# Patient Record
Sex: Female | Born: 1940 | Race: White | Hispanic: No | Marital: Married | Smoking: Never smoker
Health system: Southern US, Community
[De-identification: ages and names within clinical notes are randomized; demographics above are authoritative.]

## PROBLEM LIST (undated history)

## (undated) DIAGNOSIS — E119 Type 2 diabetes mellitus without complications: Secondary | ICD-10-CM

## (undated) DIAGNOSIS — R42 Dizziness and giddiness: Secondary | ICD-10-CM

## (undated) DIAGNOSIS — I10 Essential (primary) hypertension: Secondary | ICD-10-CM

## (undated) DIAGNOSIS — K219 Gastro-esophageal reflux disease without esophagitis: Secondary | ICD-10-CM

## (undated) DIAGNOSIS — G629 Polyneuropathy, unspecified: Secondary | ICD-10-CM

## (undated) DIAGNOSIS — K589 Irritable bowel syndrome without diarrhea: Secondary | ICD-10-CM

## (undated) HISTORY — PX: BACK SURGERY: SHX140

## (undated) HISTORY — PX: ABDOMINAL HYSTERECTOMY: SHX81

---

## 1999-03-21 ENCOUNTER — Encounter: Admission: RE | Admit: 1999-03-21 | Discharge: 1999-03-21 | Payer: Self-pay | Admitting: Gynecology

## 1999-03-21 ENCOUNTER — Encounter: Payer: Self-pay | Admitting: Gynecology

## 2000-08-25 ENCOUNTER — Ambulatory Visit (HOSPITAL_COMMUNITY): Admission: RE | Admit: 2000-08-25 | Discharge: 2000-08-25 | Payer: Self-pay | Admitting: Gastroenterology

## 2000-09-01 ENCOUNTER — Encounter: Payer: Self-pay | Admitting: Internal Medicine

## 2000-09-01 ENCOUNTER — Ambulatory Visit (HOSPITAL_COMMUNITY): Admission: RE | Admit: 2000-09-01 | Discharge: 2000-09-01 | Payer: Self-pay | Admitting: Internal Medicine

## 2000-09-22 ENCOUNTER — Inpatient Hospital Stay (HOSPITAL_COMMUNITY): Admission: AD | Admit: 2000-09-22 | Discharge: 2000-09-26 | Payer: Self-pay | Admitting: Internal Medicine

## 2000-09-22 ENCOUNTER — Encounter: Payer: Self-pay | Admitting: Internal Medicine

## 2000-09-23 ENCOUNTER — Encounter: Payer: Self-pay | Admitting: Internal Medicine

## 2000-09-30 ENCOUNTER — Ambulatory Visit (HOSPITAL_COMMUNITY): Admission: RE | Admit: 2000-09-30 | Discharge: 2000-09-30 | Payer: Self-pay | Admitting: *Deleted

## 2000-10-22 ENCOUNTER — Encounter: Payer: Self-pay | Admitting: Emergency Medicine

## 2000-10-22 ENCOUNTER — Encounter (HOSPITAL_COMMUNITY): Admission: RE | Admit: 2000-10-22 | Discharge: 2000-11-21 | Payer: Self-pay | Admitting: Emergency Medicine

## 2000-10-22 ENCOUNTER — Emergency Department (HOSPITAL_COMMUNITY): Admission: EM | Admit: 2000-10-22 | Discharge: 2000-10-22 | Payer: Self-pay | Admitting: Emergency Medicine

## 2000-11-11 ENCOUNTER — Ambulatory Visit (HOSPITAL_COMMUNITY): Admission: RE | Admit: 2000-11-11 | Discharge: 2000-11-11 | Payer: Self-pay | Admitting: *Deleted

## 2000-11-28 ENCOUNTER — Ambulatory Visit (HOSPITAL_COMMUNITY): Admission: RE | Admit: 2000-11-28 | Discharge: 2000-11-28 | Payer: Self-pay | Admitting: Internal Medicine

## 2001-03-20 ENCOUNTER — Encounter: Payer: Self-pay | Admitting: Internal Medicine

## 2001-03-20 ENCOUNTER — Ambulatory Visit (HOSPITAL_COMMUNITY): Admission: RE | Admit: 2001-03-20 | Discharge: 2001-03-20 | Payer: Self-pay | Admitting: Internal Medicine

## 2001-10-30 ENCOUNTER — Emergency Department (HOSPITAL_COMMUNITY): Admission: EM | Admit: 2001-10-30 | Discharge: 2001-10-30 | Payer: Self-pay | Admitting: Emergency Medicine

## 2004-04-03 ENCOUNTER — Ambulatory Visit: Payer: Self-pay | Admitting: Internal Medicine

## 2004-10-02 ENCOUNTER — Ambulatory Visit: Payer: Self-pay | Admitting: Internal Medicine

## 2004-12-17 ENCOUNTER — Ambulatory Visit (HOSPITAL_COMMUNITY): Payer: Self-pay | Admitting: Neurology

## 2004-12-17 ENCOUNTER — Encounter (HOSPITAL_COMMUNITY): Admission: RE | Admit: 2004-12-17 | Discharge: 2005-01-16 | Payer: Self-pay | Admitting: Neurology

## 2005-01-01 ENCOUNTER — Ambulatory Visit: Payer: Self-pay | Admitting: Internal Medicine

## 2005-01-24 ENCOUNTER — Ambulatory Visit (HOSPITAL_COMMUNITY): Admission: RE | Admit: 2005-01-24 | Discharge: 2005-01-24 | Payer: Self-pay | Admitting: Internal Medicine

## 2005-01-24 ENCOUNTER — Ambulatory Visit: Payer: Self-pay | Admitting: Internal Medicine

## 2005-01-24 ENCOUNTER — Encounter (INDEPENDENT_AMBULATORY_CARE_PROVIDER_SITE_OTHER): Payer: Self-pay | Admitting: Internal Medicine

## 2005-01-24 HISTORY — PX: COLONOSCOPY: SHX174

## 2005-01-24 HISTORY — PX: ESOPHAGOGASTRODUODENOSCOPY: SHX1529

## 2005-04-02 ENCOUNTER — Ambulatory Visit: Payer: Self-pay | Admitting: Internal Medicine

## 2005-04-29 ENCOUNTER — Ambulatory Visit: Payer: Self-pay | Admitting: Internal Medicine

## 2005-05-31 ENCOUNTER — Ambulatory Visit (HOSPITAL_COMMUNITY): Admission: RE | Admit: 2005-05-31 | Discharge: 2005-05-31 | Payer: Self-pay | Admitting: Internal Medicine

## 2005-06-18 ENCOUNTER — Ambulatory Visit (HOSPITAL_COMMUNITY): Admission: RE | Admit: 2005-06-18 | Discharge: 2005-06-18 | Payer: Self-pay | Admitting: Ophthalmology

## 2005-07-16 ENCOUNTER — Ambulatory Visit (HOSPITAL_COMMUNITY): Admission: RE | Admit: 2005-07-16 | Discharge: 2005-07-16 | Payer: Self-pay | Admitting: Ophthalmology

## 2005-07-25 ENCOUNTER — Ambulatory Visit (HOSPITAL_COMMUNITY): Admission: RE | Admit: 2005-07-25 | Discharge: 2005-07-25 | Payer: Self-pay | Admitting: Internal Medicine

## 2005-07-29 ENCOUNTER — Ambulatory Visit: Payer: Self-pay | Admitting: Internal Medicine

## 2006-07-28 ENCOUNTER — Ambulatory Visit (HOSPITAL_COMMUNITY): Admission: RE | Admit: 2006-07-28 | Discharge: 2006-07-28 | Payer: Self-pay | Admitting: Obstetrics and Gynecology

## 2006-09-04 ENCOUNTER — Ambulatory Visit (HOSPITAL_COMMUNITY): Admission: RE | Admit: 2006-09-04 | Discharge: 2006-09-04 | Payer: Self-pay | Admitting: Internal Medicine

## 2006-09-08 ENCOUNTER — Inpatient Hospital Stay (HOSPITAL_COMMUNITY): Admission: AD | Admit: 2006-09-08 | Discharge: 2006-09-16 | Payer: Self-pay | Admitting: Internal Medicine

## 2006-12-01 ENCOUNTER — Emergency Department (HOSPITAL_COMMUNITY): Admission: EM | Admit: 2006-12-01 | Discharge: 2006-12-01 | Payer: Self-pay | Admitting: Emergency Medicine

## 2007-05-21 HISTORY — PX: COLONOSCOPY: SHX174

## 2007-08-03 ENCOUNTER — Ambulatory Visit (HOSPITAL_COMMUNITY): Admission: RE | Admit: 2007-08-03 | Discharge: 2007-08-03 | Payer: Self-pay | Admitting: Internal Medicine

## 2007-08-10 ENCOUNTER — Ambulatory Visit (HOSPITAL_COMMUNITY): Admission: RE | Admit: 2007-08-10 | Discharge: 2007-08-10 | Payer: Self-pay | Admitting: Internal Medicine

## 2007-09-20 ENCOUNTER — Inpatient Hospital Stay (HOSPITAL_COMMUNITY): Admission: EM | Admit: 2007-09-20 | Discharge: 2007-09-22 | Payer: Self-pay | Admitting: Emergency Medicine

## 2007-09-20 ENCOUNTER — Ambulatory Visit: Payer: Self-pay | Admitting: Internal Medicine

## 2007-09-21 ENCOUNTER — Encounter: Payer: Self-pay | Admitting: Internal Medicine

## 2007-09-22 ENCOUNTER — Ambulatory Visit: Payer: Self-pay | Admitting: Gastroenterology

## 2007-11-03 ENCOUNTER — Ambulatory Visit: Payer: Self-pay | Admitting: Internal Medicine

## 2008-02-25 ENCOUNTER — Ambulatory Visit: Admission: RE | Admit: 2008-02-25 | Discharge: 2008-02-25 | Payer: Self-pay | Admitting: Neurology

## 2008-05-24 ENCOUNTER — Ambulatory Visit (HOSPITAL_COMMUNITY): Admission: RE | Admit: 2008-05-24 | Discharge: 2008-05-24 | Payer: Self-pay | Admitting: Internal Medicine

## 2008-10-11 ENCOUNTER — Encounter (INDEPENDENT_AMBULATORY_CARE_PROVIDER_SITE_OTHER): Payer: Self-pay | Admitting: *Deleted

## 2008-11-25 DIAGNOSIS — R197 Diarrhea, unspecified: Secondary | ICD-10-CM

## 2008-11-25 DIAGNOSIS — Z8719 Personal history of other diseases of the digestive system: Secondary | ICD-10-CM

## 2008-11-25 DIAGNOSIS — K219 Gastro-esophageal reflux disease without esophagitis: Secondary | ICD-10-CM | POA: Insufficient documentation

## 2008-11-25 DIAGNOSIS — R159 Full incontinence of feces: Secondary | ICD-10-CM | POA: Insufficient documentation

## 2008-11-25 DIAGNOSIS — K559 Vascular disorder of intestine, unspecified: Secondary | ICD-10-CM | POA: Insufficient documentation

## 2008-11-25 DIAGNOSIS — K589 Irritable bowel syndrome without diarrhea: Secondary | ICD-10-CM | POA: Insufficient documentation

## 2008-11-25 DIAGNOSIS — R109 Unspecified abdominal pain: Secondary | ICD-10-CM | POA: Insufficient documentation

## 2008-11-25 DIAGNOSIS — K2960 Other gastritis without bleeding: Secondary | ICD-10-CM | POA: Insufficient documentation

## 2008-11-29 ENCOUNTER — Ambulatory Visit: Payer: Self-pay | Admitting: Internal Medicine

## 2008-11-29 DIAGNOSIS — K921 Melena: Secondary | ICD-10-CM

## 2008-12-26 ENCOUNTER — Telehealth (INDEPENDENT_AMBULATORY_CARE_PROVIDER_SITE_OTHER): Payer: Self-pay

## 2009-01-13 ENCOUNTER — Encounter: Payer: Self-pay | Admitting: Internal Medicine

## 2009-01-17 ENCOUNTER — Ambulatory Visit: Payer: Self-pay | Admitting: Internal Medicine

## 2009-01-17 ENCOUNTER — Encounter: Admission: RE | Admit: 2009-01-17 | Discharge: 2009-01-17 | Payer: Self-pay | Admitting: Podiatry

## 2009-01-18 ENCOUNTER — Telehealth (INDEPENDENT_AMBULATORY_CARE_PROVIDER_SITE_OTHER): Payer: Self-pay

## 2009-01-19 ENCOUNTER — Encounter: Payer: Self-pay | Admitting: Internal Medicine

## 2009-01-19 ENCOUNTER — Ambulatory Visit (HOSPITAL_COMMUNITY): Admission: RE | Admit: 2009-01-19 | Discharge: 2009-01-19 | Payer: Self-pay | Admitting: Internal Medicine

## 2009-03-02 ENCOUNTER — Telehealth (INDEPENDENT_AMBULATORY_CARE_PROVIDER_SITE_OTHER): Payer: Self-pay

## 2009-03-03 ENCOUNTER — Ambulatory Visit: Payer: Self-pay | Admitting: Gastroenterology

## 2009-03-03 ENCOUNTER — Encounter: Payer: Self-pay | Admitting: Gastroenterology

## 2009-03-03 DIAGNOSIS — K5732 Diverticulitis of large intestine without perforation or abscess without bleeding: Secondary | ICD-10-CM | POA: Insufficient documentation

## 2009-03-03 LAB — CONVERTED CEMR LAB
Basophils Absolute: 0 10*3/uL (ref 0.0–0.1)
Basophils Relative: 0 % (ref 0–1)
Eosinophils Absolute: 0.2 10*3/uL (ref 0.0–0.7)
Eosinophils Relative: 2 % (ref 0–5)
HCT: 43.8 % (ref 36.0–46.0)
Hemoglobin: 14.9 g/dL (ref 12.0–15.0)
MCHC: 34.1 g/dL (ref 30.0–36.0)
MCV: 94.7 fL (ref 78.0–100.0)
Monocytes Absolute: 1 10*3/uL (ref 0.1–1.0)
Neutro Abs: 8.3 10*3/uL — ABNORMAL HIGH (ref 1.7–7.7)
RDW: 12.7 % (ref 11.5–15.5)

## 2009-03-04 ENCOUNTER — Ambulatory Visit (HOSPITAL_COMMUNITY): Admission: RE | Admit: 2009-03-04 | Discharge: 2009-03-04 | Payer: Self-pay | Admitting: Internal Medicine

## 2009-03-06 ENCOUNTER — Telehealth (INDEPENDENT_AMBULATORY_CARE_PROVIDER_SITE_OTHER): Payer: Self-pay

## 2009-03-06 ENCOUNTER — Encounter: Payer: Self-pay | Admitting: Internal Medicine

## 2009-03-21 ENCOUNTER — Telehealth (INDEPENDENT_AMBULATORY_CARE_PROVIDER_SITE_OTHER): Payer: Self-pay

## 2009-03-28 ENCOUNTER — Ambulatory Visit (HOSPITAL_COMMUNITY): Admission: RE | Admit: 2009-03-28 | Discharge: 2009-03-28 | Payer: Self-pay | Admitting: Internal Medicine

## 2009-03-28 ENCOUNTER — Encounter: Payer: Self-pay | Admitting: Internal Medicine

## 2009-03-28 ENCOUNTER — Ambulatory Visit: Payer: Self-pay | Admitting: Internal Medicine

## 2009-03-29 ENCOUNTER — Encounter: Payer: Self-pay | Admitting: Internal Medicine

## 2009-08-03 ENCOUNTER — Ambulatory Visit (HOSPITAL_COMMUNITY): Admission: RE | Admit: 2009-08-03 | Discharge: 2009-08-03 | Payer: Self-pay | Admitting: Internal Medicine

## 2009-11-02 ENCOUNTER — Ambulatory Visit (HOSPITAL_COMMUNITY): Admission: RE | Admit: 2009-11-02 | Discharge: 2009-11-02 | Payer: Self-pay | Admitting: Internal Medicine

## 2009-11-03 IMAGING — CT CT ABDOMEN W/ CM
2 of 5 series · 16 of 46 positions shown, 18 images · IV contrast (Omnipaque 300)
Comparison: CT 01/19/2009

 CT ABDOMEN

03/04/2009 - DUPLICATE COPY for exam association in [facility] – No change from original report.
CLINICAL DATA: History of diverticulitis. Lower abdominal pain.
 Blood in stool

 CT ABDOMEN AND PELVIS WITH CONTRAST
TECHNIQUE: Multidetector CT imaging of the abdomen and pelvis was
 performed using the standard protocol following bolus
 administration of intravenous contrast.
 Contrast: 100 ml Bmnipaque-EDD IV and oral contrast media.

[Series 2: abd_pel_with 5.0 b40f · axial · 0.81mm/px · z∈[-445,-15]mm · 13 of 100 slices shown, 15 images]
[im 7/100  soft-tissue]
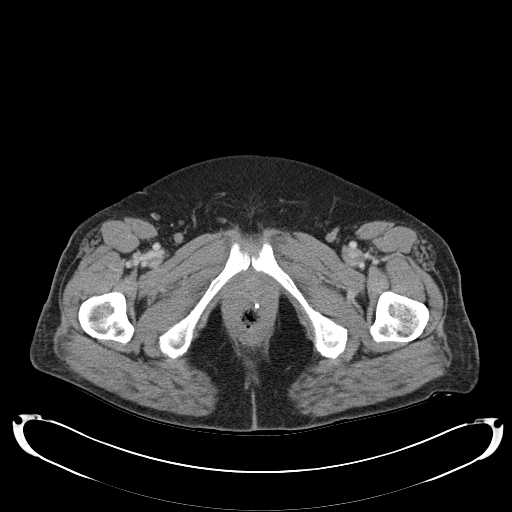
[im 7/100  bone]
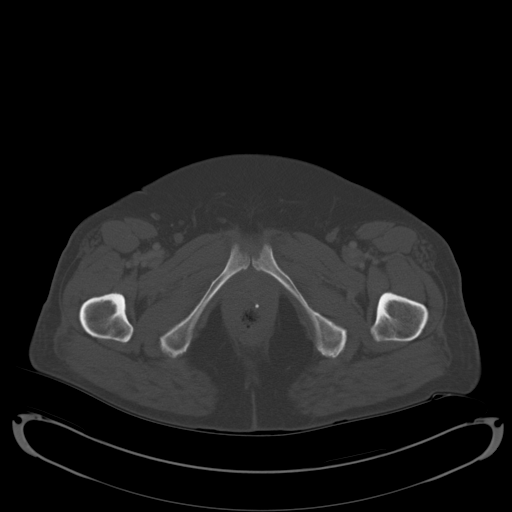
[im 13/100  soft-tissue]
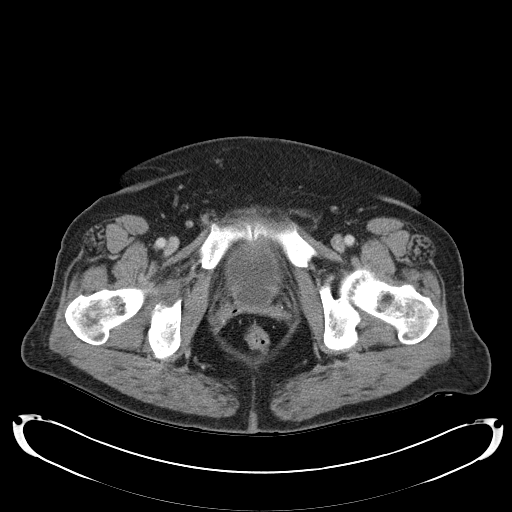
[im 19/100  soft-tissue]
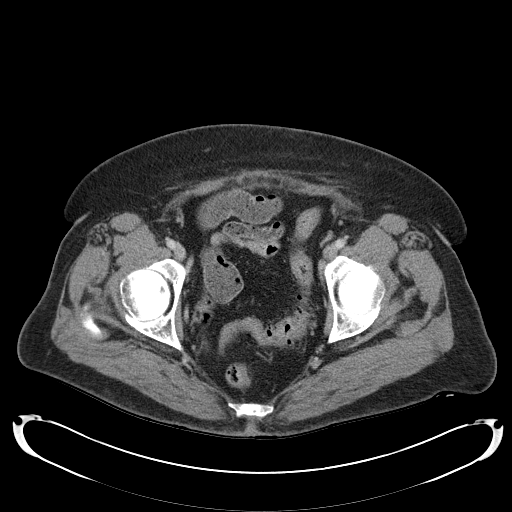
[im 31/100  soft-tissue]
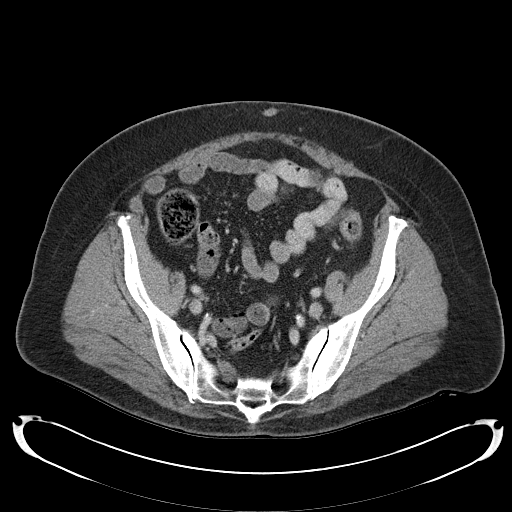
[im 38/100  soft-tissue]
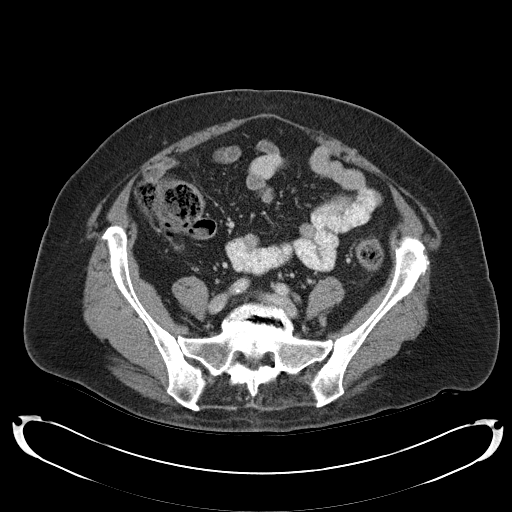
[im 44/100  soft-tissue]
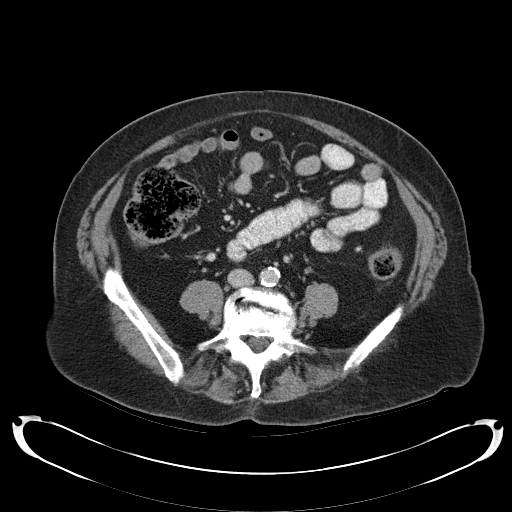
[im 50/100  soft-tissue]
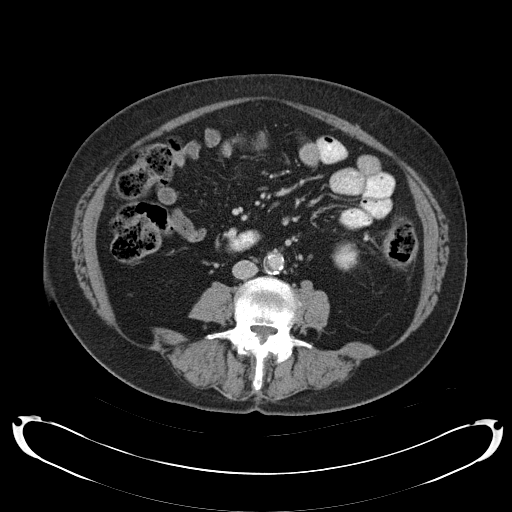
[im 56/100  soft-tissue]
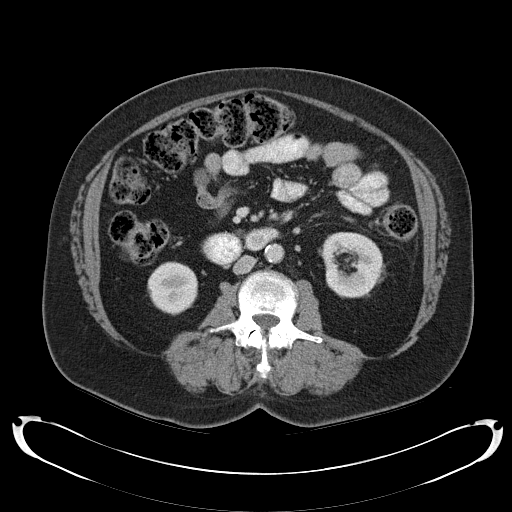
[im 62/100  soft-tissue]
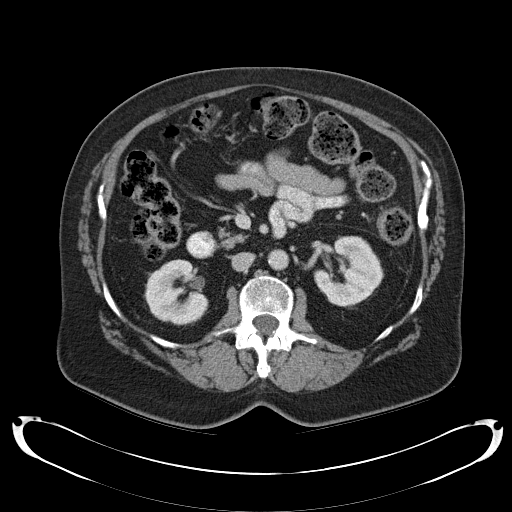
[im 62/100  bone]
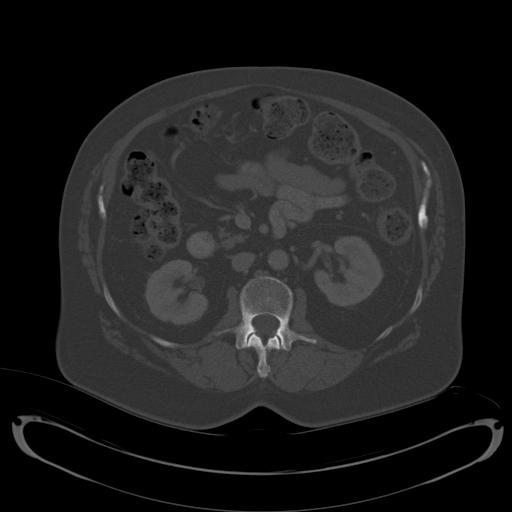
[im 69/100  soft-tissue]
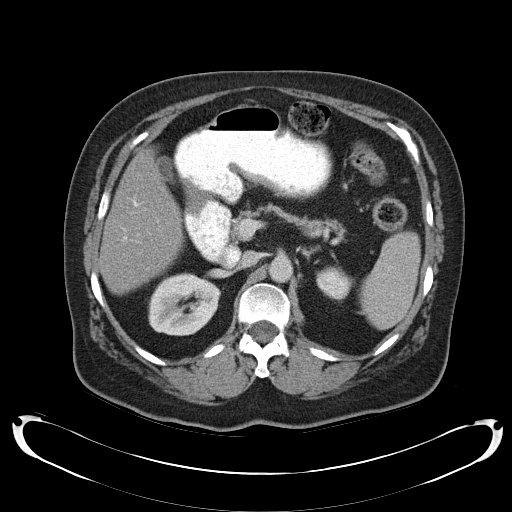
[im 81/100  soft-tissue]
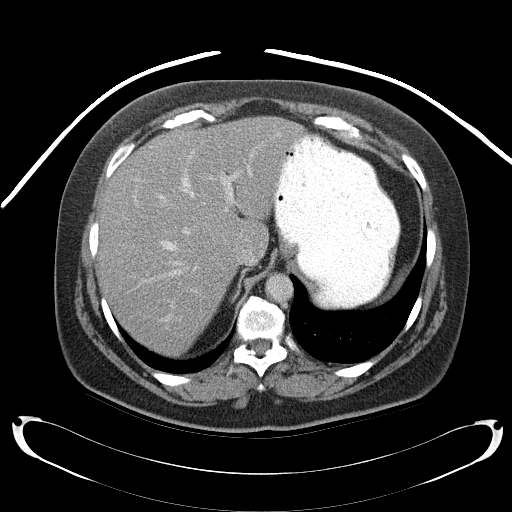
[im 87/100  soft-tissue]
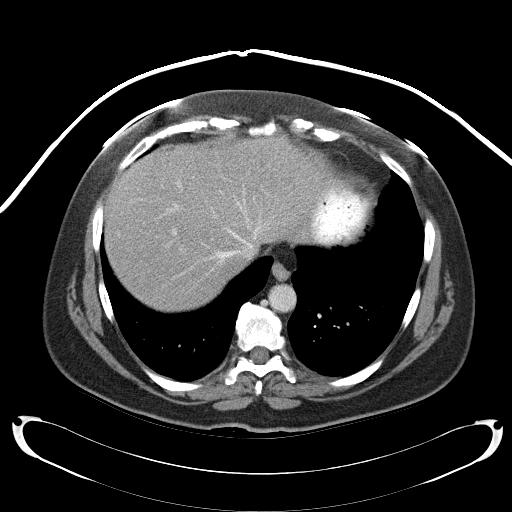
[im 93/100  soft-tissue]
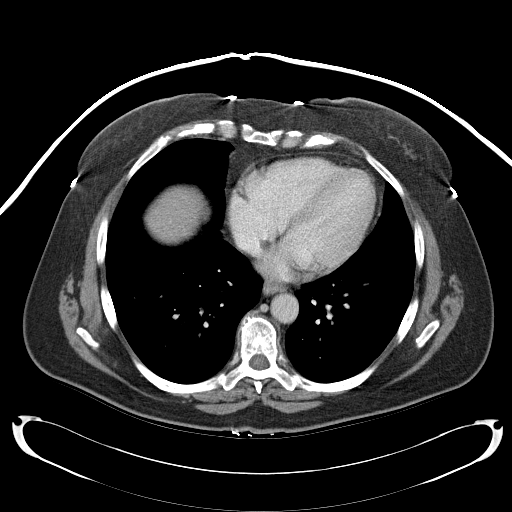

[Series 4: mpr cor post contrast (id) · coronal · 0.75mm/px · 3 of 91 slices shown]
[im 31/91  soft-tissue]
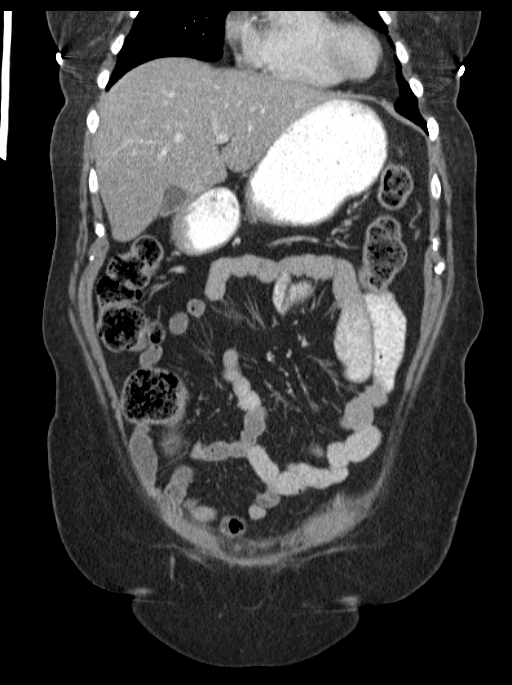
[im 41/91  soft-tissue]
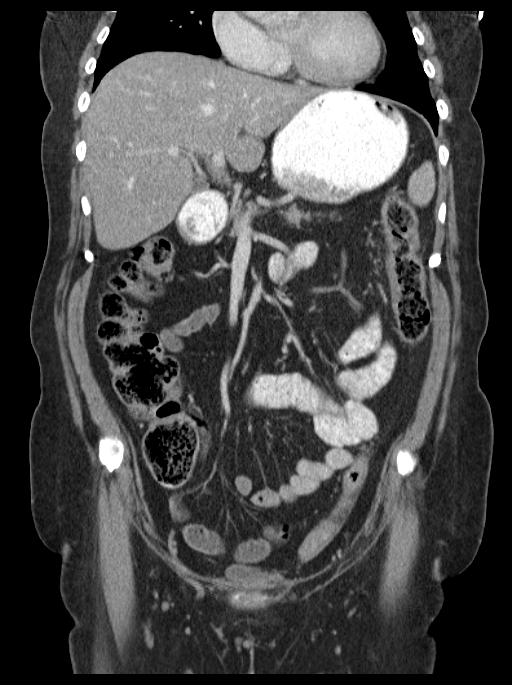
[im 51/91  soft-tissue]
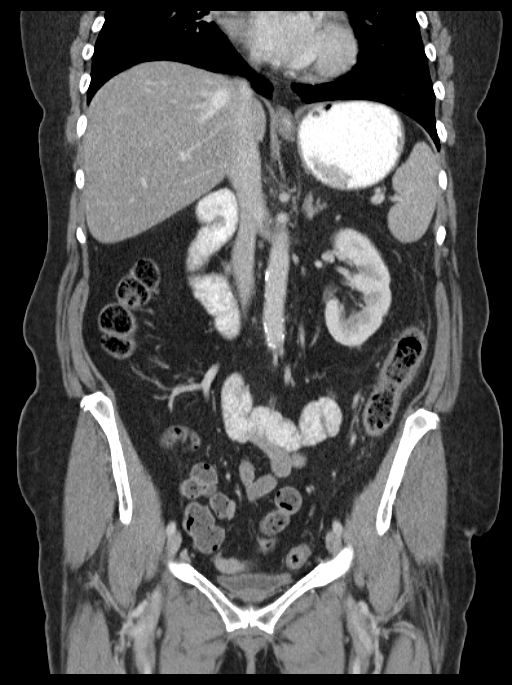

[16 of 46 positions shown; findings below may reference images not displayed]

FINDINGS: Fatty infiltration of the liver. No hepatic space-
 occupying lesions. No biliary ductal dilatation. Calcified
 splenic granuloma. Atrophic pancreas. Segmental wall thickening
 of the descending colon extending over a rather lengthy segment
 with mild pericolic edema and an injected appearance of the vessels
 subtending the colon. The findings are compatible with colitis. No
 small bowel wall thickening. I would favor colitis over
 diverticulitis. Findings are similar to the 01/19/2009 exam.
IMPRESSION: Findings consistent with colitis of the left colon.

 CT PELVIS
FINDINGS: No acute pelvic findings. Sigmoid colon diverticula.
 Negative for diverticulitis. No wall thickening of the rectum.
 Hysterectomy.
IMPRESSION: Sigmoid diverticulosis.

## 2010-04-13 ENCOUNTER — Encounter: Admission: RE | Admit: 2010-04-13 | Discharge: 2010-04-13 | Payer: Self-pay | Admitting: Neurosurgery

## 2010-06-06 ENCOUNTER — Inpatient Hospital Stay (HOSPITAL_COMMUNITY)
Admission: RE | Admit: 2010-06-06 | Discharge: 2010-06-09 | Payer: Self-pay | Source: Home / Self Care | Attending: Neurosurgery | Admitting: Neurosurgery

## 2010-06-06 LAB — DIFFERENTIAL
Basophils Absolute: 0 10*3/uL (ref 0.0–0.1)
Basophils Relative: 0 % (ref 0–1)
Eosinophils Absolute: 0.3 10*3/uL (ref 0.0–0.7)
Eosinophils Relative: 4 % (ref 0–5)
Lymphocytes Relative: 19 % (ref 12–46)
Lymphs Abs: 1.5 10*3/uL (ref 0.7–4.0)
Monocytes Absolute: 0.7 10*3/uL (ref 0.1–1.0)
Monocytes Relative: 8 % (ref 3–12)
Neutro Abs: 5.5 10*3/uL (ref 1.7–7.7)
Neutrophils Relative %: 69 % (ref 43–77)

## 2010-06-06 LAB — SURGICAL PCR SCREEN
MRSA, PCR: NEGATIVE
Staphylococcus aureus: NEGATIVE

## 2010-06-06 LAB — CBC
HCT: 41.1 % (ref 36.0–46.0)
Hemoglobin: 13.2 g/dL (ref 12.0–15.0)
MCH: 29.5 pg (ref 26.0–34.0)
MCHC: 32.1 g/dL (ref 30.0–36.0)
MCV: 91.7 fL (ref 78.0–100.0)
Platelets: 164 10*3/uL (ref 150–400)
RBC: 4.48 MIL/uL (ref 3.87–5.11)
RDW: 12.6 % (ref 11.5–15.5)
WBC: 8 10*3/uL (ref 4.0–10.5)

## 2010-06-06 LAB — TYPE AND SCREEN
ABO/RH(D): A POS
Antibody Screen: NEGATIVE

## 2010-06-06 LAB — BASIC METABOLIC PANEL
BUN: 16 mg/dL (ref 6–23)
CO2: 31 mEq/L (ref 19–32)
Calcium: 9.4 mg/dL (ref 8.4–10.5)
Chloride: 99 mEq/L (ref 96–112)
Creatinine, Ser: 0.76 mg/dL (ref 0.4–1.2)
GFR calc Af Amer: >60 mL/min (ref >60)
GFR calc non Af Amer: >60 mL/min (ref >60)
Glucose, Bld: 140 mg/dL — ABNORMAL HIGH (ref 70–99)
Potassium: 4.3 mEq/L (ref 3.5–5.1)
Sodium: 139 mEq/L (ref 135–145)

## 2010-06-06 LAB — ABO/RH: ABO/RH(D): A POS

## 2010-06-11 LAB — BASIC METABOLIC PANEL
BUN: 11 mg/dL (ref 6–23)
CO2: 28 mEq/L (ref 19–32)
Calcium: 8.4 mg/dL (ref 8.4–10.5)
Creatinine, Ser: 0.8 mg/dL (ref 0.4–1.2)
Glucose, Bld: 143 mg/dL — ABNORMAL HIGH (ref 70–99)

## 2010-06-11 LAB — GLUCOSE, CAPILLARY
Glucose-Capillary: 117 mg/dL — ABNORMAL HIGH (ref 70–99)
Glucose-Capillary: 98 mg/dL (ref 70–99)
Glucose-Capillary: 165 mg/dL — ABNORMAL HIGH (ref 70–99)
Glucose-Capillary: 119 mg/dL — ABNORMAL HIGH (ref 70–99)

## 2010-06-11 LAB — CBC
HCT: 37.4 % (ref 36.0–46.0)
Hemoglobin: 12.3 g/dL (ref 12.0–15.0)
MCH: 30.1 pg (ref 26.0–34.0)
MCHC: 32.9 g/dL (ref 30.0–36.0)

## 2010-06-12 LAB — GLUCOSE, CAPILLARY
Glucose-Capillary: 126 mg/dL — ABNORMAL HIGH (ref 70–99)
Glucose-Capillary: 124 mg/dL — ABNORMAL HIGH (ref 70–99)

## 2010-06-21 NOTE — Discharge Summary (Signed)
  [name], Paula [name]                ACCOUNT NO.:  [id number]  MEDICAL RECORD NO.:  [id number]          PATIENT TYPE:  INP  LOCATION:  3014                         FACILITY:  [facility]  PHYSICIAN:  Cristi Loron, M.D.DATE OF BIRTH:  January 29, 1941  DATE OF ADMISSION:  06/06/2010 DATE OF DISCHARGE:  06/09/2010                              DISCHARGE SUMMARY   BRIEF HISTORY:  The patient is a 70 year old white female who has had prior back surgery.  She has developed recurrent back and leg pain consistent with neurogenic claudication.  She has failed medical management, was worked up with a lumbar MRI which demonstrated the patient had multilevel disk degeneration.  The patient had developed a spondylolisthesis, spinal stenosis at L3-4.  I discussed various treatment options with the patient including surgery.  The patient has weighed the risks, benefits, and alternatives of surgery and decided to proceed with the L3-L5 decompression instrumentation and fusion.  For further details of this admission, please refer to typed history and physical.  HOSPITAL COURSE:  I admitted the patient to [facility] on June 06, 2010.  On day of admission, I performed L3-4 decompression instrumentation and fusion.  The surgery went well (for full details of this operation, please refer to typed operative note).  POSTOPERATIVE COURSE:  The patient's postop course was unremarkable. She was discharged home on postop day #3.  DISCHARGE INSTRUCTIONS:  The patient was given oral and written discharge instructions.  All her questions were answered.  DISCHARGE PRESCRIPTIONS: 1. Percocet 10/325, 1/2 to 1 p.o. q.4 h. p.r.n. for pain. 2. Valium 5 mg, #50, 1 p.o. q.6 h. p.r.n. for muscle spasms.  FINAL DIAGNOSES:  Is L3-4 spondylolisthesis, spinal stenosis, disk degeneration, lumbago, lumbar radiculopathy.  PROCEDURE PERFORMED:  Bilateral L3 laminotomies and foraminotomies to decompress the  bilateral L3 as well as L4 nerve roots (work required to do this was in addition to work required to do the posterior lumbar interbody fusion because of the patient's severe facet arthropathy and spinal stenosis requiring bilateral foraminotomies and medial facetectomy); L3-4 posterior lumbar fusion with local morselized autograft bone and Actifuse bone graft extender; insertion of L3-4 interbody prosthesis (PEEK interbody prosthesis; L3-4 posterior instrumentation with legacy titanium pedicle screw rods; L3-4 posterolateral arthrodesis with local morselized autograft bone and Vitoss bone graft).     Cristi Loron, M.D.     JDJ/MEDQ  D:  06/09/2010  T:  06/09/2010  Job:  [id number]  Electronically Signed by Tressie Stalker M.D. on 06/21/2010 10:55:52 AM

## 2010-06-21 NOTE — Op Note (Signed)
NAMESHADI, LARNER                ACCOUNT NO.:  [id number]  MEDICAL RECORD NO.:  [id number]          PATIENT TYPE:  INP  LOCATION:  2899                         FACILITY:  [facility]  PHYSICIAN:  Cristi Loron, M.D.DATE OF BIRTH:  [birth year]  DATE OF PROCEDURE:  06/06/2010 DATE OF DISCHARGE:                              OPERATIVE REPORT   BRIEF HISTORY:  The patient is a 70 year old white female who has had prior back surgeries.  She has developed recurrent back and leg pain consistent with neurogenic claudication.  She failed medical management, was worked up with a lumbar MRI which demonstrated the patient had a multilevel disk degeneration, but had developed a spondylolisthesis and spinal stenosis at L3-4.  I discussed the various treatment options with the patient including surgery.  The patient has weighed the risks, benefits, and alternatives of the surgery, decided to proceed with an L3- 4 decompression, instrumentation, and fusion.  PREOPERATIVE DIAGNOSES:  L3-4 spondylolisthesis, spinal stenosis, disk degeneration, lumbago, lumbar radiculopathy.  POSTOPERATIVE DIAGNOSES:  L3-4 spondylolisthesis, spinal stenosis, disk degeneration, lumbago, lumbar radiculopathy.  PROCEDURE:  Bilateral L3 laminotomies, foraminotomies to decompress the bilateral L3 as well as L4 nerve roots (The work required to do this was in addition to work required due to posterior lumbar interbody fusion because of the patient's severe facet arthropathy and spinal stenosis requiring bilateral foraminotomies and medial facetectomies); L3-4 posterior lumbar interbody fusion with local morselized autograft bone, and Actifuse bone graft extender; insertion of L3-4 interbody prosthesis (Capstone PEEK interbody prosthesis); L3-4 posterior instrumentation with Legacy titanium pedicle screws and rods; L3-4 posterolateral arthrodesis with local morselized autograft bone, and Vitoss bone  graft extender.  SURGEON:  Cristi Loron, M.D.  ASSISTANT:  Danae Orleans. Venetia Maxon, M.D.  ANESTHESIA:  General endotracheal.  ESTIMATED BLOOD LOSS:  200 mL.  SPECIMENS:  None.  DRAINS:  None.  COMPLICATIONS:  None.  DESCRIPTION OF PROCEDURE:  The patient was brought to the operating room by anesthesia team.  General endotracheal anesthesia was induced.  The patient was turned to the prone position on the Wilson frame.  The lumbosacral region was then prepared with Betadine scrub and Betadine solution.  Sterile drapes were applied.  I then injected the area to be incised with Marcaine with epinephrine solution.  I used a scalpel to make a linear midline incision over the L3-4 interspace.  I used an electrocautery to perform a bilateral subperiosteal dissection exposing the spinous process of lamina of L2, L3, L4, and L5.  We obtained intraoperative radiograph to confirm our location and inserted the Versa- Trac retractor for exposure.  We began decompression by using high-speed drill to perform bilateral L3 laminotomies.  We widened these laminotomies with Kerrison punch removing the L3-4 ligamentum flavum in the cephalad aspect of the L4 lamina.  We then performed wide foraminotomies about the bilateral L3 as well as L4 nerve roots completing the decompression.  We now turned our attention to the posterior lumbar interbody fusion.  I incised the L3-4 intervertebral disk space with 15 blade scalpel.  We performed a partial intervertebral diskectomy with a pituitary forceps. We then prepared the  vertebral endplates with the curettes removing the remainder of the intervertebral disk.  We then used the trial spacer and determined to use a 10 x 26 mm interbody prosthesis.  We prefilled this prosthesis with combination of local morselized autograft bone we obtained during the decompression as well as Actifuse bone graft extender.  We inserted the prosthesis into the interspaces, of  course, after retracting the neural structures out of the harm's way.  There was a good snug fit of the prosthesis in the interspace.  We filled the remainder of the interspace with local autograft bone and Actifuse. This completed the posterior lumbar interbody fusion.  We now turned our attention to the posterior instrumentation.  Under fluoroscopic guidance, we cannulated the bilateral L3 and L4 pedicles with bone probe.  We tapped the pedicle with a 6.5 mm tap.  We then removed the tap and probed inside the pedicle to rule out cortical breeches.  We inserted 7.5 x 15 mm pedicle screws into the bilateral L3 and L4 pedicles under fluoroscopic guidance and we got a good bony purchase.  We then connected unilateral pedicle screws with a lordotic rod.  We compressed the construct of screw and the rod in place with the caps, which we tightened appropriately.  This completed the instrumentation.  We now turned our attention to the posterolateral arthrodesis.  We used high-speed drill to decorticate the remainder of the L3-4 facet, pars, transverse process, etc., and then laid a combination of local morselized autograft bone and Vitoss bone graft extender over these decorticated posterolateral structures completing the posterolateral arthrodesis.  We then obtained hemostasis using bipolar cautery.  We irrigated the wound out with bacitracin solution.  We inspected the thecal sac and bilateral L3 and L4 nerve roots, and noted to be well decompressed.  We then removed our retractors and reapproximated the patient's thoracolumbar fascia with interrupted #1 Vicryl suture, subcutaneous tissue with a 2-0 Vicryl suture, and the skin with Steri-Strips and Benzoin.  The wound was then coated with bacitracin ointment.  Sterile dressing was applied.  The drapes were removed and the patient was subsequently returned to supine position where she was extubated by the anesthesia team and transported to  post anesthesia care unit in stable condition.  All sponge, instrument, and needle counts were correct at the end of this case.     Cristi Loron, M.D.     JDJ/MEDQ  D:  06/06/2010  T:  06/07/2010  Job:  [id number]  Electronically Signed by Tressie Stalker M.D. on 06/21/2010 10:56:04 AM

## 2010-10-02 NOTE — Assessment & Plan Note (Signed)
Paula [name], [name]                 CHART#:  [id number]   DATE:  11/03/2007                       DOB:  19-Apr-1941   Followup of recent hospitalization for ischemic colitis.  The patient  had acute illness recently, for which she was hospitalized.  We saw her  in consultation.  The colonoscopy revealed a typical change for ischemic  colitis.  Biopsies were confirmative that this was a self-limiting  illness.  She does not smoke.  She never had prior symptoms.  She has  type 2 diabetes mellitus and is now on metformin.  Her reflux symptoms  are well controlled on Protonix.  She is a good 25-30 pounds over her  ideal body weight range and has central obesity.  No family history of  colorectal neoplasia.  She would be slated for a repeat screening  colonoscopy in 10 years.   She has a rather extensive chart here going back to the days with Dr.  Welton Flakes back in the 1980s where she has had diarrhea and intermittent fecal  incontinence.  She saw Dr. Roosvelt Harps and Dr. Kinnie Scales back in 2002.  She underwent anorectal manometry without specific findings aside from  low-normal sphincter pressures and significantly decreased threshold of  sensation.  She was actually on Lotronex for a period of time, but tells  me it did no better than Lomotil.  The good news is now that she states  she rarely takes any Lomotil and has relatively normal bowel function.   Her mother had similar problems with diarrhea.  She certainly has not  taken the Lotronex for at least the past 2 years.   CURRENT MEDICATIONS:  See updated list.   ALLERGIES:  IVP dye.   PHYSICAL EXAMINATION:  GENERAL: Today, she appears well.  Weight is 177,  height 5 feet 7 inches, temp 97.5, BP 130/82, and pulse 76.  ABDOMEN:  Obese.  Soft and nontender without appreciable mass or  organomegaly.   ASSESSMENT:  1. Recent bout of segmental ischemic colitis, resolved.  I discussed      the most likely benign and non-recurrent nature of  this illness.      If she were to have another attack, we would need to do some      vascular studies.  2. Gastroesophageal reflux disease.  Symptoms quiescent on Protonix.  3. History of diarrhea and fecal incontinence, quiescent at this time.      I do not see  where the possibility of celiac disease was ever      considered.  This disease does run with diabetes, I feel it will be      worthwhile to check a celiac antibody panel just to screen her for      this entity.   Unless something comes up, I plan to see this nice lady back in 1 year.   I TOLD THE PATIENT SHE SHOULD NEVER TAKE LOTRONEX AGAIN.        Jonathon Bellows, M.D.  Electronically Signed     RMR/MEDQ  D:  11/03/2007  T:  11/04/2007  Job:  [id number]   cc:   Ninetta Lights D. Felecia Shelling, MD

## 2010-10-02 NOTE — H&P (Signed)
NAMECINDE, EBERT                ACCOUNT NO.:  [id number]   MEDICAL RECORD NO.:  [id number]          PATIENT TYPE:  INP   LOCATION:  A207                          FACILITY:  [facility]   PHYSICIAN:  Catalina Pizza, M.D.        DATE OF BIRTH:  14-Apr-1941   DATE OF ADMISSION:  09/20/2007  DATE OF DISCHARGE:  LH                              HISTORY & PHYSICAL   PRIMARY DOCTOR:  Dr. Ninetta Lights D. Fanta.   CHIEF COMPLAINT:  Bright red blood from rectum.   HISTORY OF PRESENT ILLNESS:  Ms. Burrowes is a pleasant 70 year old white  female with known history of hypertension and peripheral neuropathy, who  apparently was in her usual state of health until last evening when she  started having a significant abdominal pain, cramping in nature.  After  eating dinner, she went in to have a large loose bowel movement and felt  very faint and diaphoretic.  She went and laid on the bed and did not  notice any blood in her stools at that time.  Continued to have mild  cramping, but much improved after bowel movement.  She slept well  overnight, got up this morning, and had another bowel movement which she  characterized as straight blood.  At that time given the fact she was  still having abdominal pain, was brought to the emergency department for  further assessment.  She has had a history of EGD and colonoscopy done  approximately 3 years ago by Dr. Karilyn Cota which only revealed some  diverticula, but otherwise was normal.   PAST MEDICAL HISTORY:  Significant for,  1. Hypertension.  2. Peripheral neuropathy.  3. Hyperlipidemia.  4. Gastroesophageal reflux disease.  5. Recurrent urinary tract infections.  6. Diet-controlled diabetes mellitus type 2.   MEDICATIONS:  She is on,  1. Neurontin 800 mg q.i.d.  2. Desipramine 50 mg t.i.d.  3. Trileptal 600 mg t.i.d.  4. Lotensin 10 mg p.o. daily.  5. Nexium 40 mg p.o. daily.  6. Aspirin 81 mg p.o. daily.  7. Lasix 10 mg p.o. daily.  8. Zetia 10 mg p.o. nightly.  9. Nitrofurantoin 100 mg p.o. daily.   SOCIAL HISTORY:  She is married.  She is retired from [employer].  Does not  drink any alcohol.  No tobacco or other substance use.   FAMILY HISTORY:  Noncontributory for any GI issues.   REVIEW OF SYSTEMS:  The patient denies any fever or chills.  No chest  pain.  No problems with her breathing.  Only per HPI, continued with  some low abdominal pain, does not have any more nausea, vomiting, or any  diaphoresis.   PHYSICAL EXAMINATION:  VITAL SIGNS:  Temperature is 97.5, blood pressure  129/80, pulse 65, respirations 18, sating 99% on room air.  GENERAL:  This is a pleasant white female lying in bed in no acute  distress.  HEENT:  Unremarkable.  Pupils equal, round, and reactive to light and  accommodation.  No JVD.  No thyromegaly.  LUNGS:  Clear to auscultation bilaterally.  No rhonchi or wheezing.  HEART:  Regular rate and rhythm.  No murmurs, gallops, or rubs.  ABDOMEN:  Soft.  Question of some very mild lower abdominal pain to  palpation, not appreciate any masses.  Positive bowel sounds noted.  EXTREMITIES:  No lower extremity edema.  MUSCULOSKELETAL:  5/5 strength in all extremities.  NEUROLOGIC:  Alert and oriented x3.  No deficits noted.  RECTAL:  Performed in the emergency department and noted bright red  blood on exam, but did not palpate any specific masses or internal  hemorrhoids per report from ER physician, Dr. Margretta Ditty.   LABORATORY DATA:  CBC shows white count of 11.8, hemoglobin of 14.4,  platelet count of 200.  Percent neutrophils of 83, absolute neutrophils  of 9.8.  BMET shows sodium 136, potassium of 4.0, chloride 99, CO2 29,  glucose 241, BUN 17, creatinine of 0.78, calcium 8.8.  Chest x-ray shows  mild basilar atelectasis.  No pneumonia or effusion.   IMPRESSION:  This is a 70 year old white female who had apparent acute  onset of bright red blood per rectum.   ASSESSMENT AND PLAN:  1. Lower gastrointestinal bleed.   This appears that it is likely a      diverticular-type bleed.  She has no history of colitis before but      definitely given her history, it sounds like she had acute      irritation in her bowels related to blood causing significant      diarrhea and continues to have lower abdominal pain.  She does have      a mild white count and question whether she has some signs of      irritation or infection in her bowels, but much less likely at this      time.  We will get GI to see her.  Her hemoglobin has remained      stable and her vital signs are stable, and we will recheck      routinely to assure that they are staying stable.  We will place on      telemetry and continue to monitor and followup with GI.  2. Diabetes mellitus type 2.  She came in with a sugar of 240, unclear      exactly how under control her sugars are.  We will defer to      primary, but will continue to cover with sliding scale insulin for      now.  Given the fact that she is on clear liquid for possible      further GI workup, we will continue to monitor her sugars without      any significant medicines other than sliding scale insulin.  3. Peripheral neuropathy, unclear etiology, but she is on several      different medicines related to this and will continue.  4. Hyperlipidemia.  Continue with her Zetia.  5. Chronic urinary tract infections.  She is on prophylaxis for this.      We will check a UA, and continue with nitrofurantoin as previously      prescribed.   DISPOSITION:  The patient will be admitted to telemetry and monitored  routinely.  Follow up with GI.  Dr. Felecia Shelling will follow up the patient in  the morning.      Catalina Pizza, M.D.  Electronically Signed     ZH/MEDQ  D:  09/20/2007  T:  09/20/2007  Job:  [id number]

## 2010-10-02 NOTE — Op Note (Signed)
NAME:  Paula [name], DIFFEE                ACCOUNT NO.:  [id number]   MEDICAL RECORD NO.:  [id number]          PATIENT TYPE:  INP   LOCATION:  A207                          FACILITY:  [facility]   PHYSICIAN:  R. Roetta Sessions, M.D. DATE OF BIRTH:  13-Jan-1941   DATE OF PROCEDURE:  09/21/2007  DATE OF DISCHARGE:                               OPERATIVE REPORT   Ileocolonoscopy with biopsy.   INDICATIONS FOR PROCEDURE:  A 70 year old lady with abdominal cramps,  diarrhea, and gross blood per rectum.  She has had a modest drop in her  hemoglobin.  Colonoscopy is now being done.  This approach was discussed  with the patient at length.  The risks, benefits, and alternatives have  been reviewed with [name].  She is agreeable.  Please see the  documentation in the medical record.   PROCEDURE NOTE:  O2 saturation, blood pressure, pulse, and respirations  were monitored throughout the entire procedure.   CONSCIOUS SEDATION:  Versed 5 mg IV and Demerol 75 g IV in divided  doses.   INSTRUMENT:  Pentax adult and pediatric colonoscope.   FINDINGS:  Digital rectal exam revealed no abnormalities.  Endoscopic  findings:  The prep was good.  Colon:  Colonic mucosa was surveyed from  the rectosigmoid junction through the left transverse, right colon,  appendiceal orifice, ileocecal valve, and cecum.  These structures were  well seen and photographed for the record.  From this level, scope was  slowly and cautiously withdrawn.  All previously mentioned mucosal  surfaces were again seen.  Terminal ileum was intubated to 10 cm.  The  patient was noted to have marked inflammatory changes of the proximal  rectum extending again 30 cm through the sigmoid segment and tapering  off to completely normal mucosa proximal to this area.  There were  fibrotic, raw inflammatory changes of the mucosa with some areas of  ulceration from the proximal rectum through the sigmoid.  Please see  photos.  The patient also had  sigmoid diverticula.  I initially could  not make it across the inflamed segment with the adult scope, the  pediatric scope was able to get by this area.  The more proximal colon  appeared normal as did the terminal ileum mucosa.  The scope was pulled  back through.  Biopsies of the inflamed sigmoid segment were taken for  histologic study.  The scope was pulled down to the rectum.  The  remainder of the rectum was seen.  The rectal vault was small and was  unable to retroflex, although I made the attempt,  but for some reason I  was able to see the rectal mucosa very well.  The distal rectum appeared  entirely normal.  The patient tolerated the procedure well and was  reacted in endoscopy.   IMPRESSION:  Inflammatory changes to the proximal rectum and sigmoid  segments consistent with ischemic or segmental colitis, sigmoid  diverticula, status post biopsy of the sigmoid colon.  Distal rectum  appeared normal.  More proximal colon to the cecum appeared normal as  the terminal ileal mucosa.  FINDINGS:  Highly suspicious for segmental or ischemic colitis which is  always a self-limiting phenomenon and the patient does not smoke.   RECOMMENDATIONS:  1. Advance to a low residue diet.  2. No further evaluation warranted unless the patient has future      symptoms.  3. Follow up on path.      Jonathon Bellows, M.D.  Electronically Signed     RMR/MEDQ  D:  09/21/2007  T:  09/22/2007  Job:  [id number]   cc:   Catalina Pizza, M.D.  Fax: [phone]   Tesfaye D. Felecia Shelling, MD  Fax: [phone]

## 2010-10-02 NOTE — Discharge Summary (Signed)
NAME:  DELORA, GRAVATT                ACCOUNT NO.:  [id number]   MEDICAL RECORD NO.:  [id number]          PATIENT TYPE:  INP   LOCATION:  A339                          FACILITY:  [facility]   PHYSICIAN:  Tesfaye D. Felecia Shelling, MD   DATE OF BIRTH:  1940-10-18   DATE OF ADMISSION:  09/08/2006  DATE OF DISCHARGE:  04/29/2008LH                               DISCHARGE SUMMARY   DISCHARGE DIAGNOSES:  1. Pneumonia.  2. Bronchial asthma.  3. Steroid induced diabetes mellitus.  4. Hypertension.  5. Peripheral vascular disease.  6. Esophageal reflux disease.  7. Hyperlipidemia.   DISCHARGE MEDICATIONS:  1. Prednisolone 40 mg p.o. daily for 3 days, then 30 mg p.o. for 3      days, then 20 mg p.o. for 3 days, then 10 mg p.o. for 3 days, then      stop.  2. Augmentin 500 mg p.o. t.i.d. for 5 days.  3. Proventil nebulizer q.4h. as needed.  4. Neurontin 800 mg q.i.d.  5. Trileptal 600 mg t.i.d.  6. Desipramine t.i.d.  7. Zetia 10 mg daily.  8. Nexium 40 mg daily.  9. Lotensin 10 mg daily.  10.Aspirin 81 mg p.o. daily.   DISPOSITION:  The patient was discharged home in stable condition.   HOSPITAL COURSE:  This is a 70 year old female patient who has a history  of multiple medical illnesses who was admitted due to recurrent cough,  wheezing and shortness of breath.  The patient was tried to be treated  in Outpatients with oral antibiotics and inhalers.  She was given a  steroid dose pack.  However, her symptoms  continued to get worse.  The patient was admitted and was started on  steroids, IV antibiotics and a nebulizer treatment.  After prolonged  hospital stay, the patient improved.  Her steroid and antibiotics was  changed to oral and she was discharged home to be followed in  outpatient.      Tesfaye D. Felecia Shelling, MD  Electronically Signed     TDF/MEDQ  D:  09/29/2006  T:  09/29/2006  Job:  [id number]

## 2010-10-02 NOTE — Discharge Summary (Signed)
NAME:  Paula [name], Paula [name]                ACCOUNT NO.:  [id number]   MEDICAL RECORD NO.:  [id number]          PATIENT TYPE:  INP   LOCATION:  A207                          FACILITY:  [facility]   PHYSICIAN:  Tesfaye D. Felecia Shelling, MD   DATE OF BIRTH:  Apr 11, 1941   DATE OF ADMISSION:  09/20/2007  DATE OF DISCHARGE:  05/05/2009LH                               DISCHARGE SUMMARY   DISCHARGE DIAGNOSES:  1. 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  2. Diabetes mellitus, diet controlled.  3. Peripheral neuropathy.  4. Hypertension.  5. Hyperlipidemia.  6. Anemia secondary to rectal bleed.   DISCHARGE MEDICATIONS:  1. Neurontin 800 mg p.o. q.i.d.  2. Desipramine 50 mg t.i.d.  3. Trileptal 600 mg t.i.d.  4. Lotensin 10 mg daily.  5. Nexium 40 mg daily.  6. Lasix 10 mg daily.  7. Zetia 10 mg daily.  8. Nitrofurantoin 100 mg p.o. daily.   DISPOSITION:  The patient was discharged to home in a stable condition.   HOSPITAL COURSE:  This is a 70 year old female patient with history of  multiple medical illnesses who was admitted due to abdominal pain and  rectal bleed.  The patient had a serial CBC to monitor for any drop in  hemoglobin and hematocrit.  Her hemoglobin remained reasonably within  the normal range.  She also was seen by GI and underwent colonoscopy  which showed ischemic colitis.  Her symptoms improved.  The patient was  discharged to home in a stable condition to continue her regular  treatment.      Tesfaye D. Felecia Shelling, MD  Electronically Signed     TDF/MEDQ  D:  10/26/2007  T:  10/26/2007  Job:  [id number]

## 2010-10-02 NOTE — Consult Note (Signed)
NAME:  Paula [name], Paula [name]                ACCOUNT NO.:  [id number]   MEDICAL RECORD NO.:  [id number]          PATIENT TYPE:  INP   LOCATION:  A207                          FACILITY:  [facility]   PHYSICIAN:  R. Roetta Sessions, M.D. DATE OF BIRTH:  [birth year]   DATE OF CONSULTATION:  DATE OF DISCHARGE:                                 CONSULTATION   CHIEF COMPLAINT:  Bright red blood per rectum.   PRIMARY CARE PHYSICIAN:  Dr. Felecia [name].   HISTORY OF PRESENT ILLNESS:  Paula [name] is a 70 year old female.  She  developed low abdominal pain Saturday evening which was 2 days ago. She  describes it as cramp-like pain along with a stabbing pain.  She had  diaphoresis and broke out in a cold sweat. She then had a large amount  of watery diarrhea.  The next morning she awakened with cramp-like  sensation and went to the bathroom and had a large amount of bright red  blood which filled the commode and was noted on the toilet paper.  She  has continued to pass blood and clots since that time of.  She continues  to have a lower abdominal bubbling sensation but has not had any further  diarrhea.  Her pain was 10/10 at worst and is now at 2/10.  She denies  any nausea or vomiting.  Denies any fever or chills.  She did have some  significant weakness with the episode of rectal bleeding.  She has a  history of IBS and has to take Lomotil on a p.r.n. basis.  She has not  taken any Lomotil in almost a week.  She can go anywhere from several  bowel movements a day to up to 3 days without a bowel movement.  Her  weight has relatively remained stable.  She thinks she may have lost a  few intentional pounds over the last year.  She is diabetic.  Her blood  sugars have been diet controlled.   PAST MEDICAL AND SURGICAL HISTORY:  Hypertension, peripheral neuropathy,  diabetes mellitus, hyperlipidemia, chronic GERD with last EGD by Dr.  Karilyn [name] January 24, 2005. She was found to have nonerosive antral  gastritis. She had a  colonoscopy at the same time with sigmoid  diverticula and she has a history of IBS. She had a rectocele introital  stenosis and vulvar dystrophy and underwent surgery by Dr. Emelda [name] on  July 28, 2006. She had a posterior release of introital stenosis and  release of clitoral head adhesions. She has had a complete hysterectomy,  two lumbar spine surgeries. She has had her bladder tacked, a C-section  and hammer toe surgery.   MEDICATIONS PRIOR TO ADMISSION:  1. Neurontin 800 mg q.i.d.  2. Desipramine 50 mg t.i.d.  3. Trileptal 600 mg t.i.d.  4. Lotensin 10 mg daily.  5. Nexium 40 mg daily.  6. Aspirin 81 mg daily.  7. Lasix 10 mg daily.  8. Zetia 10 mg q.h.s.  9. Nitrofurantoin 100 mg daily.  10.Lomotil p.r.n.   ALLERGIES:  IV CONTRAST.   FAMILY HISTORY:  No known family history  of colorectal carcinoma or  inflammatory bowel disease or chronic GI problems.  Mother deceased at  age 35 secondary to CVA and diabetes mellitus.  Father deceased 52 with  history of coronary artery disease and diabetes mellitus.  She has two  brothers and two sisters with history significant for diabetes mellitus.   SOCIAL HISTORY:  Paula [name] is married.  She has two healthy children.  She is retired from [employer]. She denies any tobacco, alcohol or drug use.   REVIEW OF SYSTEMS:  See HPI.  GI:  She denies any heartburn,  indigestion, dysphagia, odynophagia, anorexia or early satiety.  Otherwise negative review of systems. See HPI.   PHYSICAL EXAMINATION:  VITAL SIGNS:  Weight 81.4 kg, height 56 inches,  temperature 97.9, pulse 70, respirations 18, blood pressure 129/69.  GENERAL:  She is well-developed, well-nourished Caucasian female in no  acute distress.  HEENT:  Sclera clear, nonicteric. Conjunctiva pink.  Oropharynx pink and  moist without any lesions.  NECK:  Supple without mass or thyromegaly.  CHEST:  Heart regular rate and rhythm.  Normal S1 and S2 without  murmurs, clicks, rubs or  gallops.  LUNGS:  Clear to auscultation bilaterally.  SKIN:  Pink, warm, and dry without any rash or jaundice.  ABDOMEN:  Positive bowel sounds x4.  No bruits auscultated.  Soft,  nontender, nondistended without palpable mass or hepatosplenomegaly.  No  rebound tenderness or guarding.  RECTAL:  No external lesions visualized, good sphincter tone, no  internal masses palpated. A small amount of  bright red blood was found  on digital rectal exam on the glove.  EXTREMITIES:  Without clubbing or edema bilaterally.  SKIN:  Pink warm and dry without any rash or jaundice.   LABORATORY STUDIES:  Hemoglobin 13.4, hematocrit 38.3, platelets 179.  WBC is 8.3, INR 0.9. Calcium of 8.5, sodium 139, potassium 4.1, chloride  107, CO2 28, BUN 8, creatinine 0.74, glucose 139.  Urinalysis negative.   IMPRESSION:  Paula [name] is a 70 year old female with acute onset  abdominal pain and diarrhea followed by large volume rectal bleeding for  the last 2 days. I suspect she has ischemic colitis versus diverticular  bleeding, less likely would be benign internal hemorrhoids, colorectal  carcinoma, or inflammatory bowel disease.   PLAN:  1. N.P.O. for colonoscopy by Paula [name] today. I have discussed the      procedure including the risks and benefits, which include but not      limited to bleeding, infection, perforation and drug reaction. She      agrees and informed      consent was obtained.  2. Would follow H&H.   Thank you Dr. Felecia [name] for allowing [place] to participate in the care of [name].      Paula [name], N.P.      Paula [name], M.D.  Electronically Signed    KJ/MEDQ  D:  09/21/2007  T:  09/21/2007  Job:  [id number]   cc:   Tesfaye D. Paula Shelling, MD  Fax: [phone]

## 2010-10-05 NOTE — H&P (Signed)
NAMEGLENICE, CICCONE                ACCOUNT NO.:  [id number]   MEDICAL RECORD NO.:  [id number]          PATIENT TYPE:  AMB   LOCATION:                                FACILITY:  [facility]   PHYSICIAN:  Tilda Burrow, M.D. DATE OF BIRTH:  [birth year]   DATE OF ADMISSION:  DATE OF DISCHARGE:  LH                              HISTORY & PHYSICAL   ADMITTING HISTORY   ADMISSION DIAGNOSIS:  Rectocele.   HISTORY OF PRESENT ILLNESS:  This 70 year old female retired, many years  status post hysterectomy, is admitted at this time for repair of a  symptomatic rectocele behind a very stenotic vaginal entrance.  She has  been seen in our office.   Dictation cancelled at this point by dictator.      Tilda Burrow, M.D.  Electronically Signed     JVF/MEDQ  D:  07/25/2006  T:  07/25/2006  Job:  [id number]

## 2010-10-05 NOTE — Procedures (Signed)
[place]  Patient:    Paula [name], Paula [name]                       MRN: [id number] Proc. Date: 11/11/00 Adm. Date:  [id number] Disc. Date: [id number] Attending:  Mingo Amber CC:         Griffith Citron, M.D.   Procedure Report  PROCEDURE:  Anal rectal manometry.  INDICATION:  Fecal incontinence.  PHYSICAL EXAMINATION:  RECTAL:  There was no obvious fecal soiling of the perineum.  There were no significant hemorrhoids, fissures, or internal rectal masses.  Subjectively, the anal sphincter felt weak.  The patient comprehended how to contract the sphincter but the musculature felt weak and she used accessory buttock muscles to try and contract the sphincter.  INFORMED CONSENT:  Anal rectal manometry was explained to the patient and consent obtained.  DESCRIPTION OF PROCEDURE:  The solid state anal rectal manometry probe was inserted to a depth of 10 cm and equilibration was allowed to take place for five minutes.  After this, the probe was withdrawn in 1 cm increments and the resting involuntary internal sphincter pressures were measured.  The maximal average pressure occurred at 3 cm and with 19.1 mmHg.  This is below normal. The sphincter length was 4 cm which is normal.  The probe was reinserted, and at sequential increments of withdrawal, voluntary squeezes were made by the patient.  This measures the voluntary external sphincter pressures.  The maximal average pressure was 37.2 mmHg, which is low and not even twice the internal sphincter pressure measurement which it is supposed to exceed by a factor of two minimally.  The patient was able to maintain a voluntary squeeze for 36.9 seconds which is normal.  The rectal anal inhibitory reflex was present.  The threshold volume of sensation was 50 cc in the rectal balloon which is abnormal and implies moderately severe sensory neuropathy.  She could not tolerate more than a 150 cc in the  rectal balloon without extreme urge to defecate and the sensation obtained which is abnormal.  She was able to expel the balloon voluntarily fairly easily.  IMPRESSION:  Low normal sphincter pressures with a significantly decreased threshold of sensation.  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  It is noted that there is no anal wink present on physical examination.  It is also noted that the patient takes Neurontin for an unclear peripheral neuropathy and I suspect that the rectal neuropathy is part of a generalized neuropathic process.  RECOMMENDATIONS:  Unfortunately, there is very little to offer this patient in my opinion.  Biofeedback is very difficult in patients who cannot sense a small volume in the rectum.  She may continue to practice Kegels exercises, contracting the external sphincter voluntarily which may help her, but if she does not sense the stool bolus in the rectum she is unlikely to be able to control it.  She should use fiber bulking agents and could consider using Imodium after the first bowel movement every day, although this could result in constipation.  Further evaluation for reversible causes of neuropathy could take place with her primary doctor or neurologist.  Unfortunately, wearing a pad or diapers or even some fabric plug at the anus may be her only option. DD:  11/13/00 TD:  11/13/00 Job: [id number]

## 2010-10-05 NOTE — Procedures (Signed)
NAME:  Paula [name], Paula [name]                ACCOUNT NO.:  [id number]   MEDICAL RECORD NO.:  [id number]          PATIENT TYPE:  OUT   LOCATION:  SLEE                          FACILITY:  [facility]   PHYSICIAN:  Kofi A. Gerilyn Pilgrim, M.D. DATE OF BIRTH:  Dec 29, 1940   DATE OF PROCEDURE:  DATE OF DISCHARGE:  02/25/2008                             SLEEP DISORDER REPORT   This is a 70 year old female who presents with snoring and fatigue.  The  patient is being evaluated for obstructive sleep apnea syndrome.   MEDICATIONS:  Neurontin, Trileptal, desipramine, Lotensin, Lasix,  Neurontin, Zetia, aspirin, multivitamin, metformin, meclizine, Lomotil.   Epworth sleepiness scale 7.  BMI 27.   SLEEP ARCHITECTURE:  The total recording time is 354 minutes.  The sleep  efficiency 82%.  Sleep latency 16.5 minutes.  REM latency 227 minutes.  Stage N1 of 9% , N2 of 80%, N3 of 4.8%, and REM sleep 5.5%.   RESPIRATORY SUMMARY:  The baseline oxygen saturation is 98%.  Lowest  saturation 76% .  AHI is 0.  Concurring events occurred exclusively  during REM sleep with the REM AHI of 71.   LIMB MOVEMENT SUMMARY:  The PLM index is 134.   ELECTROCARDIOGRAM SUMMARY:  Average heart rate 71 with no arrhythmias  observed.   IMPRESSION:  1. Severe periodic limb movement disordered sleep.  2. 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      Kofi A. Gerilyn Pilgrim, M.D.  Electronically Signed     KAD/MEDQ  D:  02/29/2008  T:  02/29/2008  Job:  [id number]

## 2010-10-05 NOTE — Op Note (Signed)
Paula [name], Paula [name]                ACCOUNT NO.:  [id number]   MEDICAL RECORD NO.:  [id number]          PATIENT TYPE:  AMB   LOCATION:  DAY                           FACILITY:  [facility]   PHYSICIAN:  Tilda Burrow, M.D. DATE OF BIRTH:  11/25/1940   DATE OF PROCEDURE:  07/28/2006  DATE OF DISCHARGE:                               OPERATIVE REPORT   PREOPERATIVE DIAGNOSIS:  Rectocele, introital stenosis, vulvar  dystrophy.   POSTOPERATIVE DIAGNOSIS:  Rectocele, introital stenosis, vulvar  dystrophy.   PROCEDURE PERFORMED:  1. Posterior repair release of introital stenosis.  2. Release of clitoral hood adhesions.   SURGEON:  Tilda Burrow, M.D.   ASSISTANTTomasa Rand.   ANESTHESIA:  General.   PATHOLOGY SPECIMENS:  None.   ESTIMATED BLOOD LOSS:  Minimal.   COMPLICATIONS:  None.   FINDINGS:  Evidence of prior posterior repair with reduction in diameter  of the introitus, made worse by the vulvar dystrophy resulting in  introital stenosis that would not allow placement of the index finger in  the vagina.  Additionally, there was finding of a significant rectocele  in the lower third of the vagina, with evidence of fairly firm  supportive tissue in the middle third of the vagina.  The focus of the  case was to take the rather lax perineal body, lift it cephalad such  that it could be attached with a series of sutures to the tight, strong,  supportive tissue in mid vagina.   DETAILS OF THE PROCEDURE:  The patient was seen in the operating room,  spinal anesthesia introduced x 2 and left sitting up for a minute each  time and we ended up with a very tiny area of quite dense block in the  sacral area.  The patient did not feel any discomfort and so legs were  in high lithotomy.  The peroneum was prepped and draped.  The urethra  could not be identified because the stricture had resulted in obscuring  of the urethral orifices.  She was actually kind of voiding into the  vagina above the stricture.  We then proceeded with posterior repair by  grasping the tight stricture band and splitting the mucosa in the  sagittal plane for a distance of right up to 1.5 cm which allowed the  tissues to fall to each side.  The vaginal mucosa was then grasped along  this and dissected off of the underlying connective tissue.  During the  dissection a double gloved digital right index finger was kept in the  rectum to maintain proper orientation.  At no time did we have suspicion  of injury to the bowel itself.  Allis clamps were used to grasp up in  the perineal body and identify strong supportive tissue that was well  attached laterally.  Additionally, we identified the lower portions of  the perineal body and placed Allis clamps on it.  Pulling the 2 Allis  clamps together and moving the tissues upward eliminated the rectocele  pouch as well as elevated the perineum.  This was what we did with a  series of 5 transverse vertical mattress sutures across the  peritoneum.  The result was tied down after the rectal index finger was removed.  Gloves were changed and the incision irrigated copiously.  Sutures were  tied down with good results.  This actually allowed [place] to close the  introital incision transversely across the posterior perineal body which  elevated the perineal body, dramatically improved laxity of the  introitus to allow it to be functional as a vagina.  Additionally, the  stricture that we thought we were going to take down between the urethra  and vagina was resolved.  No effort was necessary there.  The clitoral  tissues had been completely obscured though the clitoris could be  palpated beneath the skin.  The agglutinates and phimosis of the  clitoral hood tissues over the clitoris were released with normal  exposure possible once procedure was completed.  The patient tolerated  the procedure well and went to recovery room in good condition after  closure  of the  posterior perineal body and inspection for hemostasis.  Sponge and needle counts were correct.      Tilda Burrow, M.D.  Electronically Signed     JVF/MEDQ  D:  07/28/2006  T:  07/28/2006  Job:  [id number]

## 2010-10-05 NOTE — Op Note (Signed)
NAMEILY, DENNO                ACCOUNT NO.:  [id number]   MEDICAL RECORD NO.:  [id number]          PATIENT TYPE:  AMB   LOCATION:  DAY                           FACILITY:  [facility]   PHYSICIAN:  Lionel December, M.D.    DATE OF BIRTH:  May 02, 1941   DATE OF PROCEDURE:  01/24/2005  DATE OF DISCHARGE:                                 OPERATIVE REPORT   PROCEDURE:  Esophagogastroduodenoscopy followed by colonoscopy.   INDICATIONS:  Paula [name] is a 70 year old Caucasian female with chronic GERD who  is requiring double-dose PPI for symptom control. She is undergoing EGD to  make sure she does not have a large hernia or Barrett's esophagus. She has  chronic diarrhea. We felt she has irritable bowel syndrome; however, she has  never responded to therapy. She is undergoing colonoscopy to make sure she  does not have inflammatory bowel disease or microscopic colitis. Both of the  procedure risks were reviewed with the patient, and informed consent was  obtained.   PREOPERATIVE MEDICATIONS:  Cetacaine spray for pharyngeal topical  anesthesia, Demerol 50 mg IV, Versed 6 mg IV.   FINDINGS:  Procedure was performed in the endoscopy suite. The patient's  vital signs and O2 saturation were monitored during the procedure and  remained stable.   PROCEDURE #1:  Esophagogastroduodenoscopy. The patient was placed in the  left lateral position, and the Olympus videoscope was passed by oropharynx  without any difficulty into esophagus.   Esophagus. Mucosa of the esophagus was normal. The GE junction was at 39 cm  from the incisors. No ring, stricture, or hernia was noted.   Stomach. It was empty and distended very well with insufflation. Folds of  proximal stomach were normal. Examination of the mucosa revealed antral  granularity and erythema, but no erosions or ulcers were noted. Pyloric  channel was patent. Angular, fundus, and cardia were examined by  retroflexing the scope and were normal.   Duodenum. The bulbar mucosa was normal. Scope was passed to the second part  of the duodenum where mucosa and folds were normal. Endoscope was withdrawn,  and the patient prepared for procedure #2.   PROCEDURE #2:  Colonoscopy. Rectal examination performed. No abnormality  noted on external or digital exam. Olympus videoscope was placed in the  rectum and advanced under vision into sigmoid colon and beyond. Preparation  was excellent. She had a few scattered diverticula at sigmoid colon. Scope  was passed into cecum which was identified by appendiceal orifice and  ileocecal valve. Pictures taken for the record. As the scope was withdrawn,  colonic mucosa was once again carefully examined, and there were no polyps,  tumor masses, or mucosa changes to suggest colitis. Random biopsies were  taken from the sigmoid colon for routine histology. Rectal mucosa similarly  was normal. Scope was retroflexed to examine anorectal junction which was  unremarkable. Endoscope was straightened and withdrawn. The patient  tolerated the procedures well.   FINAL DIAGNOSIS:  1.  Nonerosive antral gastritis, otherwise normal      esophagogastroduodenoscopy. No evidence of Barrett's esophagus.  2.  Few  small diverticula at sigmoid colon. No evidence of colitis or      stricture or mass. Random biopsies were taken from the sigmoid colon      looking for microscopic/collagenous colitis.   RECOMMENDATIONS:  1.  Trial with FiberChoice tablets. She will start at half a tablet a day      and gradually build it up to two pills per day.  2.  Lomotil 1 before breakfast and 1 before lunch. She will keep stool      diary.  3.  Helicobacter pylori serology will be checked today.  4.  I will be contacting patient with results of pending studies next week.      Lionel December, M.D.  Electronically Signed     NR/MEDQ  D:  01/24/2005  T:  01/24/2005  Job:  [id number]   cc:   Tesfaye D. Felecia Shelling, MD  [address]  [place]  [place] [address]  Fax: [phone]

## 2010-10-05 NOTE — H&P (Signed)
NAME:  Paula [name], Paula [name]                ACCOUNT NO.:  [id number]   MEDICAL RECORD NO.:  [id number]          PATIENT TYPE:  INP   LOCATION:  A339                          FACILITY:  [facility]   PHYSICIAN:  Tesfaye D. Felecia Shelling, MD   DATE OF BIRTH:  March 22, 1941   DATE OF ADMISSION:  09/08/2006  DATE OF DISCHARGE:  LH                              HISTORY & PHYSICAL   CHIEF COMPLAINT:  Shortness of breath and wheezing   HISTORY OF PRESENT ILLNESS:  This is a 70 year old female patient with a  history of hypertension, peripheral neuropathy came to the office with  above complaint. Patient was seen about four days back with similar  symptoms and she was started on oral antibiotics and symptomatic  treatment. Her chest x-ray showed sign of bronchitis; however, over the  weekend the patient's symptoms continued to get worse. She has been  having wheezing and shortness of breath. Her oral intake decreased. The  patient was feeling week and unable to ambulate. Patient was re-  evaluated in the office today and patient was found to have diffuse  bilateral wheezes and decreased air entry. The possibility of  superimposed pneumonia is considered and the patient was admitted direct  for IV antibiotics and nebulizer treatment.   REVIEW OF SYSTEMS:  The patient feels very weak and short of breath. She  has recurrent cough with yellowish sputum. No fever or chills. Patient  has headache and generalized weakness. No chest pain, nausea, vomiting,  dysuria, or frequency of urination.   PAST MEDICAL HISTORY:  1. Hypertension  2. Peripheral neuropathy, etiology unclear  3. Hyperlipidemia  4. Hypertension   CURRENT MEDICATIONS:  1. Neurontin 800 mg p.o. q.i.d.  2. Desipramine 50 mg t.i.d.  3. __________  600 mg t.i.d.  4. Lotensin 10 mg p.o. daily  5. __________  500 mg b.i.d.  6. Nexium 40 mg p.o. daily  7. Aspirin 81 mg p.o. daily   SOCIAL HISTORY:  Patient is a married. She is currently retired. No  history of alcohol, tobacco, or substance abuse.   PHYSICAL EXAMINATION:  GENERAL:  Patient is alert, awake, acutely sick  looking.  VITAL SIGNS:  Blood pressure:  130/80, Pulse:  88, Respiratory rate:  24, Temperature:  98 degree Fahrenheit  HEENT:  Pupils are equal and reactive.  NECK:  Supple.  CHEST:  Poor air entry. Bilateral diffuse rhonchi and expiratory  wheezes.  CARDIOVASCULAR:  First and second S sound heard. There are no murmur, no  gallop.  ABDOMEN:  Soft and left __________  is positive. No mass, no  organomegaly.  EXTREMITIES:  No leg edema.   ASSESSMENT:  This is a 71 year old female patient who has symptoms of  cough, shortness of breath, and bilateral wheezes. Her chest x-ray done  about three days back showed acute bronchitis; however, patient's  symptoms are worsening. The possibility of superimposed pneumonia is  considered.   PLAN:  We will start patient on combination of IV antibiotics and  nebulizer treatment. We will repeat chest x-ray. We will continue her  baseline medications. We will do  routine labs.      Tesfaye D. Felecia Shelling, MD  Electronically Signed     TDF/MEDQ  D:  09/08/2006  T:  09/09/2006  Job:  [id number]

## 2010-10-05 NOTE — H&P (Signed)
Paula [name], Paula [name]                ACCOUNT NO.:  [id number]   MEDICAL RECORD NO.:  [id number]          PATIENT TYPE:  AMB   LOCATION:  DAY                           FACILITY:  [facility]   PHYSICIAN:  Tilda Burrow, M.D. DATE OF BIRTH:  1941-05-07   DATE OF ADMISSION:  DATE OF DISCHARGE:  LH                              HISTORY & PHYSICAL   ADMITTING DIAGNOSES:  Symptomatic rectocele.   HISTORY OF PRESENT ILLNESS:  This is a 70 year old retired female,  status post hysterectomy many years ago, former patient of Dr. Gilford Silvius x  years, is admitted at this time for a rectocele repair.  She also may  require some relaxation of the posterior peroneal body.  She thinks that  she has had a posterior repair at some point in time.  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  She has good vaginal  depth but a significant rectocele, which is resulting in difficulty with  bowel movements and also has sufficient stenosis, that posterior  peroneal flap may be necessary (perineoplasty).  She has been seen in  our office and brings a bowel movement history.  She is having multiple  bowel movements a day that require lots of straining.  She frequently  requires Lomotil to gain the ability to defecate.  The condition is made  more challenging by the fact she has a peripheral neuropathy which seems  to be affecting her upper extremities primarily.  She has decent bladder  and bowel control.  She does have a sensation of incomplete evacuation  going through the day, with frequent stool losses, says she has to wear  a panty liner located to prevent soilage to her panties over the course  of the day.   PAST MEDICAL HISTORY:  Managed by Dr. Felecia Shelling.   MEDICATIONS:  Neurontin, Trileptal, desipramine, estradiol 0.5 mg daily,  Lotensin 10 mg p.o. daily, Lasix 20 mg p.o. daily and Niaspan 500 mg  twice a day.  She takes Septra DS twice a week for prophylaxis, aspirin  once a day recently  discontinued, Antivert and meclizine as needed.   ALLERGIES:  SHE HAS ALLERGIES TO IVP DYE.   SURGERIES:  Back surgery x2, a residual neuropathy.  She had a  hysterectomy, bilateral salpingo-oophorectomy.  She has had some  perineal surgery at the time of removal of a vaginal wall cyst, bladder  tacking, hammertoe correction, along with hysterectomy, R. Chales Salmon.   PHYSICAL EXAMINATION:  GENERAL:  She is a healthy slim-bodied Caucasian  female who is in good overall health.  VITAL SIGNS:  Weight 183.8 pounds, blood pressure 160/90.  HEENT:  Pupils equal, round, reactive.  NECK:  Supple.  CHEST:  Clear to auscultation.  ABDOMEN:  Slim without masses or adenopathy.  EXTERNAL GENITALIA.  Vulva dystrophy present.  Good vaginal length.  Clinically significant rectocele hidden beneath the stenotic introitus.   PLAN:  Rectocele repair with flap revision of posterior perineal body,  to try to overcome the severe vulvar dystrophy.      Tilda Burrow, M.D.  Electronically Signed  JVF/MEDQ  D:  07/25/2006  T:  07/25/2006  Job:  [id number]   cc:   [facility]

## 2011-02-13 LAB — BASIC METABOLIC PANEL
BUN: 17
BUN: 8
CO2: 29
Calcium: 8.8
Chloride: 99
Creatinine, Ser: 0.74
Creatinine, Ser: 0.78
GFR calc Af Amer: >60
GFR calc Af Amer: >60
GFR calc non Af Amer: >60
Glucose, Bld: 241 — ABNORMAL HIGH

## 2011-02-13 LAB — DIFFERENTIAL
Basophils Absolute: 0
Basophils Absolute: 0
Basophils Relative: 0
Eosinophils Absolute: 0.2
Eosinophils Absolute: 0.3
Eosinophils Absolute: 0.3
Eosinophils Relative: 3
Lymphocytes Relative: 13
Lymphocytes Relative: 19
Lymphs Abs: 1.6
Monocytes Relative: 6
Neutro Abs: 5.6
Neutro Abs: 9.8 — ABNORMAL HIGH
Neutrophils Relative %: 68
Neutrophils Relative %: 77
Neutrophils Relative %: 83 — ABNORMAL HIGH

## 2011-02-13 LAB — URINALYSIS, ROUTINE W REFLEX MICROSCOPIC
Ketones, ur: NEGATIVE
Nitrite: NEGATIVE
Protein, ur: NEGATIVE
pH: 6.5

## 2011-02-13 LAB — HEMOGLOBIN AND HEMATOCRIT, BLOOD
HCT: 36.5
Hemoglobin: 13.1

## 2011-02-13 LAB — CBC
HCT: 39.9
MCHC: 35.1
MCV: 91.3
Platelets: 179
Platelets: 199
Platelets: 200
RBC: 4.5
RDW: 12.4
RDW: 12.5
WBC: 11.5 — ABNORMAL HIGH
WBC: 8.3

## 2011-02-13 LAB — PROTIME-INR: INR: 0.9

## 2011-03-05 LAB — CBC
Hemoglobin: 17 — ABNORMAL HIGH
RBC: 5.45 — ABNORMAL HIGH
RDW: 13.3
WBC: 18.1 — ABNORMAL HIGH

## 2011-03-05 LAB — DIFFERENTIAL
Basophils Relative: 0
Eosinophils Absolute: 0
Monocytes Absolute: 0.8 — ABNORMAL HIGH
Monocytes Relative: 4
Neutrophils Relative %: 91 — ABNORMAL HIGH

## 2011-03-05 LAB — COMPREHENSIVE METABOLIC PANEL
ALT: 55 — ABNORMAL HIGH
Alkaline Phosphatase: 114
Chloride: 98
Glucose, Bld: 185 — ABNORMAL HIGH
Potassium: 4.2
Sodium: 135
Total Protein: 7

## 2011-03-05 LAB — URINALYSIS, ROUTINE W REFLEX MICROSCOPIC
Glucose, UA: NEGATIVE
Hgb urine dipstick: NEGATIVE
Ketones, ur: 40 — AB
Protein, ur: 30 — AB

## 2011-03-05 LAB — URINE CULTURE
Colony Count: NO GROWTH
Culture: NO GROWTH

## 2011-03-05 LAB — URINE MICROSCOPIC-ADD ON

## 2011-05-07 ENCOUNTER — Other Ambulatory Visit (HOSPITAL_COMMUNITY): Payer: Self-pay | Admitting: Internal Medicine

## 2011-05-07 ENCOUNTER — Ambulatory Visit (HOSPITAL_COMMUNITY)
Admission: RE | Admit: 2011-05-07 | Discharge: 2011-05-07 | Disposition: A | Discharge status: Home / Self Care | Payer: Medicare Other | Source: Ambulatory Visit | Attending: Internal Medicine | Admitting: Internal Medicine

## 2011-05-07 DIAGNOSIS — M25559 Pain in unspecified hip: Secondary | ICD-10-CM | POA: Insufficient documentation

## 2011-05-07 DIAGNOSIS — M25552 Pain in left hip: Secondary | ICD-10-CM

## 2011-10-21 DIAGNOSIS — G9009 Other idiopathic peripheral autonomic neuropathy: Secondary | ICD-10-CM | POA: Insufficient documentation

## 2011-10-21 DIAGNOSIS — G63 Polyneuropathy in diseases classified elsewhere: Secondary | ICD-10-CM | POA: Insufficient documentation

## 2012-02-11 ENCOUNTER — Ambulatory Visit (HOSPITAL_COMMUNITY)
Admission: RE | Admit: 2012-02-11 | Discharge: 2012-02-11 | Disposition: A | Discharge status: Home / Self Care | Payer: Medicare Other | Source: Ambulatory Visit | Attending: Internal Medicine | Admitting: Internal Medicine

## 2012-02-11 ENCOUNTER — Other Ambulatory Visit (HOSPITAL_COMMUNITY): Payer: Self-pay | Admitting: Internal Medicine

## 2012-02-11 DIAGNOSIS — M25559 Pain in unspecified hip: Secondary | ICD-10-CM | POA: Insufficient documentation

## 2012-02-11 DIAGNOSIS — R52 Pain, unspecified: Secondary | ICD-10-CM

## 2012-02-11 DIAGNOSIS — S79919A Unspecified injury of unspecified hip, initial encounter: Secondary | ICD-10-CM | POA: Insufficient documentation

## 2012-02-11 DIAGNOSIS — S79929A Unspecified injury of unspecified thigh, initial encounter: Secondary | ICD-10-CM | POA: Insufficient documentation

## 2012-02-11 DIAGNOSIS — W19XXXA Unspecified fall, initial encounter: Secondary | ICD-10-CM

## 2012-10-15 ENCOUNTER — Telehealth: Payer: Self-pay | Admitting: Obstetrics and Gynecology

## 2012-10-15 DIAGNOSIS — N951 Menopausal and female climacteric states: Secondary | ICD-10-CM

## 2012-10-15 NOTE — Telephone Encounter (Addendum)
Pt states need prescription for Vivelle Dot patch 90 day supply faxed to [phone]. A  AS PER PT , faxed to [facility].

## 2012-10-26 MED ORDER — ESTRADIOL 0.05 MG/24HR TD PTTW: 1.0000 | MEDICATED_PATCH | TRANSDERMAL | Status: DC

## 2012-10-30 ENCOUNTER — Encounter: Payer: Self-pay | Admitting: Neurology

## 2012-10-30 DIAGNOSIS — G63 Polyneuropathy in diseases classified elsewhere: Secondary | ICD-10-CM

## 2012-10-30 DIAGNOSIS — G9009 Other idiopathic peripheral autonomic neuropathy: Secondary | ICD-10-CM

## 2012-11-02 ENCOUNTER — Encounter: Payer: Self-pay | Admitting: Neurology

## 2012-11-02 ENCOUNTER — Ambulatory Visit (INDEPENDENT_AMBULATORY_CARE_PROVIDER_SITE_OTHER): Payer: Medicare Other | Admitting: Neurology

## 2012-11-02 VITALS — BP 145/82 | HR 77 | Ht 65.0 in | Wt 167.0 lb

## 2012-11-02 DIAGNOSIS — G63 Polyneuropathy in diseases classified elsewhere: Secondary | ICD-10-CM

## 2012-11-02 DIAGNOSIS — G9009 Other idiopathic peripheral autonomic neuropathy: Secondary | ICD-10-CM

## 2012-11-02 MED ORDER — OXCARBAZEPINE 600 MG PO TABS: 600.0000 mg | ORAL_TABLET | Freq: Three times a day (TID) | ORAL | Status: DC

## 2012-11-02 MED ORDER — DESIPRAMINE HCL 50 MG PO TABS: 50.0000 mg | ORAL_TABLET | Freq: Three times a day (TID) | ORAL | Status: DC

## 2012-11-02 MED ORDER — GABAPENTIN 800 MG PO TABS: 800.0000 mg | ORAL_TABLET | Freq: Four times a day (QID) | ORAL | Status: DC

## 2012-11-02 NOTE — Progress Notes (Signed)
  History of Present Illness  HPI: 72 year old Caucasian female, follow up for peripheral neuropathy.  She has past medical history of diabetes for couple of years, well controlled, hyperlipidemia, hypertension presenting with 20 years history of peripheral neuropathy, previously responded transiently to IVIG treatments.   EMG nerve conduction study severe, axonal, length dependent peripheral neuropathy, no evidence of demyelination.  skin biopsy, demonstrated loss of small fibers, but no vasculitis, amyloid deposit.   Lab  evaluation, B12 264, normal CMP, TSH, ANA, double-stranded DNA,  immunofixation protein phoresis, Lyme titer, SSA b, HIV, A1c6.8,  Her main complaint is, ascending numbness, tingling, burning pain, in her feet, leg, and hand.  She is currently taking Neurontin 800 q.i.d.,desipramine  50 t.i.d., Trileptal 600 t.i.d., Mirapex 1 mg every day.  She underwent bilateral L3 laminotomies, foraminotomies to decompress the bilateral L3 as well as L4 nerve roots in Jan, 2012, she recovered well. But Unfortunately, her husband has suffered a stroke, just recently discharged home from rehabilitation. She continues to have bilateral feet and fingertips paresthesia, current combination medications make her symptoms under control, she does complaints of sleepiness, she goes to the gym every day, has lost few pounds  UPDATE June 16th 2014: She is loosing balance easily, continues to complains of feet and hands paresthesia.  She is taking trileptal 600mg  tid, which makes her dizzy, she has to sit down afterwards.  She still exercise everyday,    Her husband had stroke vascular dementia, he was put into assistant living, she visit him regularly, which has caused a lot of emotional turmoil  Review of Systems  Out of a complete 14 system review, the patient complains of only the following symptoms, and all other reviewed systems are negative.   Constitutional:   fatigue Cardiovascular:   N/A Ear/Nose/Throat:  Ringing in ears Skin: N/A Eyes: N/A Respiratory: N/A Gastroitestinal: diarrhea   Hematology/Lymphatic:  N/A Endocrine:  Feeling hot, flushing Musculoskeletal: joints pain Allergy/Immunology: N/A Neurological: N/A Psychiatric:    depression  Neurologic Exam  Mental Status:  Depressed looking elderly female, awake, alert, cooperative to history, talking, and casual conversation. Cranial Nerves: CN II-XII pupils were equal round reactive to light.  Extraocular movements were full.  Visual fields were full on confrontational testl  Facial sensation and strength were normal.  Hearing was intact to finger rubbing bilaterally.  Uvula tongue were midling.  Head turning and shoulder shrugging were normal and symmetric.  Tongue protrusion into the cheeks strength were normal. Motor: Normal tone, bulk, and strength, with exception of mild toe extension and flexion weakness. Sensory: Length dependent decreaesd light touch, pinprick to above knee leve, to mid forearm decreased proprioception at toes, and vibratory sensation to knee level. Coordination: Normal finger-to-nose and heel-to-shin.  There was no dysmetria noticed. Gait and Station: Narrow based and steady, has difficulty perform tiptoe, heel, and tandem walking.  [place] sign: Negative. Reflexes: Deep tendon reflexes: Bicepts: 2/2, Brachioradialis: 2/2, Triceps: 2/2 Patellar: 1/1 Achilles: absent  Plantar responses were flexor.   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  1, Continue current medication treatment, 90 days supple with 3 refills were provided. Trileptal 600 mg 3 times a day, Neurontin 800 mg 4 times a day, desipramine 50 mg 3 times a day 2 to clinic with Eber Jones in one year.

## 2012-11-10 ENCOUNTER — Encounter (HOSPITAL_COMMUNITY)
Admission: RE | Disposition: A | Discharge status: Home / Self Care | Payer: Self-pay | Source: Ambulatory Visit | Attending: Ophthalmology

## 2012-11-10 ENCOUNTER — Encounter (HOSPITAL_COMMUNITY): Payer: Self-pay | Admitting: *Deleted

## 2012-11-10 ENCOUNTER — Ambulatory Visit (HOSPITAL_COMMUNITY)
Admission: RE | Admit: 2012-11-10 | Discharge: 2012-11-10 | Disposition: A | Discharge status: Home / Self Care | Payer: Medicare Other | Source: Ambulatory Visit | Attending: Ophthalmology | Admitting: Ophthalmology

## 2012-11-10 DIAGNOSIS — G589 Mononeuropathy, unspecified: Secondary | ICD-10-CM | POA: Insufficient documentation

## 2012-11-10 DIAGNOSIS — E7800 Pure hypercholesterolemia, unspecified: Secondary | ICD-10-CM | POA: Insufficient documentation

## 2012-11-10 DIAGNOSIS — H409 Unspecified glaucoma: Secondary | ICD-10-CM | POA: Insufficient documentation

## 2012-11-10 DIAGNOSIS — I10 Essential (primary) hypertension: Secondary | ICD-10-CM | POA: Insufficient documentation

## 2012-11-10 DIAGNOSIS — K219 Gastro-esophageal reflux disease without esophagitis: Secondary | ICD-10-CM | POA: Insufficient documentation

## 2012-11-10 DIAGNOSIS — Z7982 Long term (current) use of aspirin: Secondary | ICD-10-CM | POA: Insufficient documentation

## 2012-11-10 DIAGNOSIS — Z79899 Other long term (current) drug therapy: Secondary | ICD-10-CM | POA: Insufficient documentation

## 2012-11-10 HISTORY — PX: YAG LASER APPLICATION: SHX6189

## 2012-11-10 SURGERY — TREATMENT, USING YAG LASER
Anesthesia: LOCAL | Laterality: Right

## 2012-11-10 MED ORDER — TROPICAMIDE 1 % OP SOLN
1.0000 [drp] | OPHTHALMIC | Status: AC
  Administered 2012-11-10 (×2): 1 [drp] via OPHTHALMIC

## 2012-11-10 MED ORDER — TROPICAMIDE 1 % OP SOLN
OPHTHALMIC | Status: AC
  Filled 2012-11-10: qty 3

## 2012-11-10 NOTE — Brief Op Note (Addendum)
Sutton B Quiett 11/10/2012  Keandra Medero T. Nile Riggs, MD  Pre-op diagnosis: opacification right eye  Post-op diagnosis: opacification right eye  Yag Laser Self Test Completedyes. Procedure: Posterior Capsulotomy, right eye.  Eye Protection Worn by Staff yes. Laser In Use Sign on Door yes.  Laser: Nd:YAG Spot Size: Fixed Burst Mode: III Power Setting: 3.4 mJ/burst  Number of shots: 15 Total energy delivered: 49.8 mJ  Patency of the peripheral iridotomy was confirmed visually.  The patient tolerated the procedure without difficulty. No complications were encountered.    The patient was discharged home with the instructions to continue all her current glaucoma medications, if any.   Patient instructed to go to office at 0100 for intraocular pressure check.  Patient verbalizes understanding of discharge instructions yes.

## 2012-11-10 NOTE — H&P (Signed)
The patient was re examined and there is no change in the patients condition since the original H and P. 

## 2012-11-12 ENCOUNTER — Encounter (HOSPITAL_COMMUNITY): Payer: Self-pay | Admitting: Ophthalmology

## 2012-11-17 ENCOUNTER — Encounter (HOSPITAL_COMMUNITY): Payer: Self-pay | Admitting: Pharmacy Technician

## 2012-11-30 ENCOUNTER — Ambulatory Visit (HOSPITAL_COMMUNITY)
Admission: RE | Admit: 2012-11-30 | Discharge: 2012-11-30 | Disposition: A | Discharge status: Home / Self Care | Payer: Medicare Other | Source: Ambulatory Visit | Attending: Internal Medicine | Admitting: Internal Medicine

## 2012-11-30 ENCOUNTER — Other Ambulatory Visit (HOSPITAL_COMMUNITY): Payer: Self-pay | Admitting: Internal Medicine

## 2012-11-30 DIAGNOSIS — W19XXXA Unspecified fall, initial encounter: Secondary | ICD-10-CM | POA: Insufficient documentation

## 2012-11-30 DIAGNOSIS — M25559 Pain in unspecified hip: Secondary | ICD-10-CM | POA: Insufficient documentation

## 2012-12-08 ENCOUNTER — Encounter (HOSPITAL_COMMUNITY)
Admission: RE | Disposition: A | Discharge status: Home / Self Care | Payer: Self-pay | Source: Ambulatory Visit | Attending: Ophthalmology

## 2012-12-08 ENCOUNTER — Encounter (HOSPITAL_COMMUNITY): Payer: Self-pay | Admitting: *Deleted

## 2012-12-08 ENCOUNTER — Ambulatory Visit (HOSPITAL_COMMUNITY)
Admission: RE | Admit: 2012-12-08 | Discharge: 2012-12-08 | Disposition: A | Discharge status: Home / Self Care | Payer: Medicare Other | Source: Ambulatory Visit | Attending: Ophthalmology | Admitting: Ophthalmology

## 2012-12-08 DIAGNOSIS — H26499 Other secondary cataract, unspecified eye: Secondary | ICD-10-CM | POA: Insufficient documentation

## 2012-12-08 HISTORY — DX: Essential (primary) hypertension: I10

## 2012-12-08 HISTORY — DX: Type 2 diabetes mellitus without complications: E11.9

## 2012-12-08 HISTORY — PX: YAG LASER APPLICATION: SHX6189

## 2012-12-08 SURGERY — TREATMENT, USING YAG LASER
Anesthesia: LOCAL | Laterality: Left

## 2012-12-08 MED ORDER — TROPICAMIDE 1 % OP SOLN
OPHTHALMIC | Status: AC
  Filled 2012-12-08: qty 3

## 2012-12-08 MED ORDER — TROPICAMIDE 1 % OP SOLN
1.0000 [drp] | OPHTHALMIC | Status: AC
  Administered 2012-12-08 (×2): 1 [drp] via OPHTHALMIC
  Filled 2012-12-08: qty 2

## 2012-12-08 NOTE — Brief Op Note (Addendum)
Paula [name] 12/08/2012  [name] T. Nile Riggs, MD  Pre-op diagnosis: Opacification left eye Post-op diagnosis: same  Yag Laser Self Test Completedyes. Procedure: Posterior Capsulotomy, left eye.  Eye Protection Worn by Staff yes. Laser In Use Sign on Door yes.   Laser: Nd:YAG Spot Size: Fixed Burst Mode: III Power Setting: 3.4 mJ/burst  Number of shots: 17 Total energy delivered: 56.2 mJ  Patency of the peripheral iridotomy was confirmed visually.  The patient tolerated the procedure without difficulty. No complications were encountered.    The patient was discharged home with the instructions to continue all her current glaucoma medications, if any.   Patient instructed to go to office at 0100 for intraocular pressure check.  Patient verbalizes understanding of discharge instructions yes.

## 2012-12-08 NOTE — H&P (Signed)
The patient was re examined and there is no change in the patients condition since the original H and P. 

## 2012-12-09 ENCOUNTER — Encounter (HOSPITAL_COMMUNITY): Payer: Self-pay | Admitting: Ophthalmology

## 2013-01-07 ENCOUNTER — Telehealth: Payer: Self-pay | Admitting: Obstetrics and Gynecology

## 2013-01-07 NOTE — Telephone Encounter (Signed)
Left message x 1. JSY 

## 2013-01-12 NOTE — Telephone Encounter (Signed)
Left message x 2. JSY 

## 2013-01-13 ENCOUNTER — Telehealth: Payer: Self-pay | Admitting: Obstetrics and Gynecology

## 2013-01-13 NOTE — Telephone Encounter (Signed)
Spoke with pt. Pt was needing her Vivelle dot. I explained that on 10/26/12, that med had been e prescribed to [facility] out of [place], 8 patches, 12 ordered. I gave the pt the phone number and she is to call them and see if they have her med. Advised to call us back with further questions. Pt voiced understanding. JSY

## 2013-02-25 ENCOUNTER — Ambulatory Visit: Payer: Medicare Other | Admitting: Podiatry

## 2013-03-16 ENCOUNTER — Encounter: Payer: Self-pay | Admitting: Podiatry

## 2013-03-16 ENCOUNTER — Ambulatory Visit (INDEPENDENT_AMBULATORY_CARE_PROVIDER_SITE_OTHER): Payer: Medicare Other | Admitting: Podiatry

## 2013-03-16 VITALS — BP 152/75 | HR 74 | Resp 16 | Ht 66.0 in | Wt 155.0 lb

## 2013-03-16 DIAGNOSIS — M79609 Pain in unspecified limb: Secondary | ICD-10-CM

## 2013-03-16 DIAGNOSIS — B351 Tinea unguium: Secondary | ICD-10-CM

## 2013-03-16 NOTE — Progress Notes (Signed)
Ms. Gafford presents today with a chief complaint of painful toenails one through 5 bilateral. Bothers her with shoe gear. And they're too thick for her to cut.  Objective: Vital signs are stable she is alert and oriented x3. Pulses are strongly palpable bilateral. Nails are thick yellow dystrophic clinically mycotic. They're painful palpation as well as debridement.  Assessment: Pain in limb secondary to onychomycosis 1 through 5 bilateral.  Plan: Debridement of nails thickness and length as a covered service secondary to pain. Followup with her in 3 months.

## 2013-04-28 ENCOUNTER — Encounter (HOSPITAL_COMMUNITY): Payer: Self-pay | Admitting: Emergency Medicine

## 2013-04-28 ENCOUNTER — Emergency Department (HOSPITAL_COMMUNITY): Payer: Medicare Other

## 2013-04-28 ENCOUNTER — Emergency Department (HOSPITAL_COMMUNITY)
Admission: EM | Admit: 2013-04-28 | Discharge: 2013-04-28 | Disposition: A | Discharge status: Home / Self Care | Payer: Medicare Other | Attending: Emergency Medicine | Admitting: Emergency Medicine

## 2013-04-28 DIAGNOSIS — G609 Hereditary and idiopathic neuropathy, unspecified: Secondary | ICD-10-CM | POA: Insufficient documentation

## 2013-04-28 DIAGNOSIS — E119 Type 2 diabetes mellitus without complications: Secondary | ICD-10-CM | POA: Insufficient documentation

## 2013-04-28 DIAGNOSIS — K219 Gastro-esophageal reflux disease without esophagitis: Secondary | ICD-10-CM | POA: Insufficient documentation

## 2013-04-28 DIAGNOSIS — Z7982 Long term (current) use of aspirin: Secondary | ICD-10-CM | POA: Insufficient documentation

## 2013-04-28 DIAGNOSIS — Z79899 Other long term (current) drug therapy: Secondary | ICD-10-CM | POA: Insufficient documentation

## 2013-04-28 DIAGNOSIS — I10 Essential (primary) hypertension: Secondary | ICD-10-CM | POA: Insufficient documentation

## 2013-04-28 DIAGNOSIS — R42 Dizziness and giddiness: Secondary | ICD-10-CM

## 2013-04-28 HISTORY — DX: Gastro-esophageal reflux disease without esophagitis: K21.9

## 2013-04-28 HISTORY — DX: Polyneuropathy, unspecified: G62.9

## 2013-04-28 LAB — CBC
MCH: 30.4 pg (ref 26.0–34.0)
MCHC: 33.6 g/dL (ref 30.0–36.0)
Platelets: 198 10*3/uL (ref 150–400)

## 2013-04-28 LAB — BASIC METABOLIC PANEL
Calcium: 8.9 mg/dL (ref 8.4–10.5)
GFR calc non Af Amer: >90 mL/min (ref >90)
Sodium: 136 mEq/L (ref 135–145)

## 2013-04-28 MED ORDER — MECLIZINE HCL 25 MG PO TABS: 25.0000 mg | ORAL_TABLET | Freq: Three times a day (TID) | ORAL | Status: DC | PRN

## 2013-04-28 MED ORDER — DIAZEPAM 5 MG PO TABS
5.0000 mg | ORAL_TABLET | Freq: Once | ORAL | Status: AC
  Administered 2013-04-28: 5 mg via ORAL
  Filled 2013-04-28: qty 1

## 2013-04-28 MED ORDER — ONDANSETRON HCL 4 MG/2ML IJ SOLN
4.0000 mg | Freq: Once | INTRAMUSCULAR | Status: AC
  Administered 2013-04-28: 4 mg via INTRAVENOUS
  Filled 2013-04-28: qty 2

## 2013-04-28 MED ORDER — MECLIZINE HCL 12.5 MG PO TABS
25.0000 mg | ORAL_TABLET | Freq: Once | ORAL | Status: AC
  Administered 2013-04-28: 25 mg via ORAL
  Filled 2013-04-28: qty 2

## 2013-04-28 MED ORDER — DIAZEPAM 5 MG PO TABS: 5.0000 mg | ORAL_TABLET | Freq: Three times a day (TID) | ORAL | Status: DC | PRN

## 2013-04-28 NOTE — ED Notes (Signed)
Pt comes via EMS with c/o hypertension and dizziness. Pt states symptoms began this morning shortly after she "took an extra dose" of neurontin due to pain in her feet. Pt denies chest pain, SOB, diaphoresis. Pt reports intermittent nausea as well as "slight" headache since symptoms began.

## 2013-04-28 NOTE — ED Provider Notes (Addendum)
CSN: [id number]     Arrival date & time 04/28/13  4098 History  This chart was scribed for Lyanne Co, MD by Quintella Reichert, ED scribe.  This patient was seen in room APA14/APA14 and the patient's care was started at 10:24 AM.   Chief Complaint  Patient presents with  . Dizziness   The history is provided by the patient and a relative. No language interpreter was used.    HPI Comments: Paula [name] is a 72 y.o. female with h/o HTN, DM, neuropathy, and vertigo who presents to the Emergency Department complaining of several hours of "dizziness" that has been occuring intermittently for the past 2 weeks.  Daughter reports that pt has had episodes of dizzines for the past several weeks and they have been becoming more frequent.  Pt states that she took 2 extra doses of Neurontin last night at 1 AM and 4 AM.  She normally only takes it at bedtime but last night her chronic neuropathic foot pain was not relieved by her regular bedtime dose.  Pt was otherwise feeling well last night and did not feel dizzy.  This morning on waking she got up and felt "dizzy" and had to sit back down.  She states that when she stood up she felt "off-balance" like "if I don't grab something I'm gonna fall."  She states this was not associated with a feeling of pre-syncope or a room-spinning sensation.  However she states she is still dizzy and currently she also reports that she has some sensation of things moving around her if she moves her head quickly.  She is inconsistent in her description of this dizziness and alternately describes it as "no sense of balance," "like I'm going to pass out," and as if things are moving.  She states "I feel like I need to go somewhere and stay" to improve her symptoms.  Pt admits to prior h/o vertigo and was seen by Dr. Katrinka Blazing ([facility]) in [place], [place] "several years ago."  She received an MRI at that time which was normal.  Daughter admits to a family h/o stroke including pt's mother and  father.  She also notes that pt is undergoing a large amount of stress due to her husband who is living in a nursing home and has vascular dementia.     Past Medical History  Diagnosis Date  . Hypertension   . Diabetes mellitus without complication   . GERD (gastroesophageal reflux disease)   . Neuropathy     Past Surgical History  Procedure Laterality Date  . Yag laser application Right 11/10/2012    Procedure: YAG LASER APPLICATION;  Surgeon: Loraine Leriche T. Nile Riggs, MD;  Location: [facility];  Service: Ophthalmology;  Laterality: Right;  . Abdominal hysterectomy    . Back surgery    . Yag laser application Left 12/08/2012    Procedure: YAG LASER APPLICATION;  Surgeon: Loraine Leriche T. Nile Riggs, MD;  Location: [facility];  Service: Ophthalmology;  Laterality: Left;    History reviewed. No pertinent family history.   History  Substance Use Topics  . Smoking status: Never Smoker   . Smokeless tobacco: Never Used  . Alcohol Use: No    OB History   Grav Para Term Preterm Abortions TAB SAB Ect Mult Living                  Review of Systems A complete 10 system review of systems was obtained and all systems are negative except as noted in  the HPI and PMH.    Allergies  Ivp dye  Home Medications   Current Outpatient Rx  Name  Route  Sig  Dispense  Refill  . aspirin 81 MG tablet   Oral   Take 81 mg by mouth daily.         . benazepril (LOTENSIN) 10 MG tablet   Oral   Take 10 mg by mouth daily.         Marland Kitchen desipramine (NORPRAMIN) 50 MG tablet   Oral   Take 1 tablet (50 mg total) by mouth 3 (three) times daily.   270 tablet   3   . diphenoxylate-atropine (LOMOTIL) 2.5-0.025 MG per tablet   Oral   Take 1 tablet by mouth as needed for diarrhea or loose stools.         Marland Kitchen esomeprazole (NEXIUM) 40 MG capsule   Oral   Take 40 mg by mouth daily.         Marland Kitchen estradiol (VIVELLE-DOT) 0.05 MG/24HR   Transdermal   Place 1 patch (0.05 mg total) onto the skin once a week.   8 patch   12    . furosemide (LASIX) 40 MG tablet   Oral   Take 40 mg by mouth daily.         Marland Kitchen gabapentin (NEURONTIN) 800 MG tablet   Oral   Take 1 tablet (800 mg total) by mouth 4 (four) times daily.   360 tablet   3   . meclizine (ANTIVERT) 12.5 MG tablet   Oral   Take 12.5 mg by mouth 3 (three) times daily as needed for dizziness.          . metFORMIN (GLUCOPHAGE) 500 MG tablet   Oral   Take 500 mg by mouth 2 (two) times daily with a meal.         . Multiple Vitamin (MULTIVITAMIN) tablet   Oral   Take 1 tablet by mouth daily.         . naproxen sodium (ANAPROX) 220 MG tablet   Oral   Take 220 mg by mouth daily as needed (pain).         . Omega-3 Fatty Acids (FISH OIL PO)   Oral   Take 1 capsule by mouth daily.         Marland Kitchen oxcarbazepine (TRILEPTAL) 600 MG tablet   Oral   Take 1 tablet (600 mg total) by mouth 3 (three) times daily.   270 tablet   3   . diazepam (VALIUM) 5 MG tablet   Oral   Take 1 tablet (5 mg total) by mouth every 8 (eight) hours as needed (dizziness).   10 tablet   0   . meclizine (ANTIVERT) 25 MG tablet   Oral   Take 1 tablet (25 mg total) by mouth 3 (three) times daily as needed for dizziness.   15 tablet   0    BP 194/89  Pulse 67  Temp(Src) 97.6 F (36.4 C) (Oral)  Resp 17  Ht 5\' 7"  (1.702 m)  Wt 170 lb (77.111 kg)  BMI 26.62 kg/m2  SpO2 98%  Physical Exam  Nursing note and vitals reviewed. Constitutional: She is oriented to person, place, and time. She appears well-developed and well-nourished.  HENT:  Head: Normocephalic and atraumatic.  Eyes: Pupils are equal, round, and reactive to light.  Cardiovascular: Regular rhythm.   Pulmonary/Chest: Effort normal.  Abdominal: Soft.  Musculoskeletal: Normal range of motion.  Neurological: She is  alert and oriented to person, place, and time.  5/5 strength in major muscle groups of  bilateral upper and lower extremities. Speech normal. No facial asymetry.  Ataxic gait.    ED  Course  Procedures (including critical care time)  DIAGNOSTIC STUDIES: Oxygen Saturation is 98% on room air, normal by my interpretation.    COORDINATION OF CARE: 10:39 AM-Informed pt and family that symptoms are likely due to vertigo.  Discussed treatment plan which includes vertigo medication and brain MRI to rule out stroke with pt at bedside and pt agreed to plan.    Labs Review Labs Reviewed  BASIC METABOLIC PANEL - Abnormal; Notable for the following:    Chloride 95 (*)    Glucose, Bld 186 (*)    All other components within normal limits  CBC    Imaging Review Mr Brain Wo Contrast  04/28/2013   CLINICAL DATA:  Dizziness and hypertension. Evaluate for posterior stroke.  EXAM: MRI HEAD WITHOUT CONTRAST  TECHNIQUE: Multiplanar, multiecho pulse sequences of the brain and surrounding structures were obtained without intravenous contrast.  COMPARISON:  12/11/2006 brain MRI from [facility]  FINDINGS: There is no evidence of acute infarct. Incidental note is made of a partially empty sella, unchanged. Age-related cerebral volume loss is present. There is no evidence of mass, midline shift, intracranial hemorrhage, or extra-axial fluid collection. Major intracranial vascular flow voids are unremarkable. Prior bilateral cataract surgery is noted. Paranasal sinuses and mastoid air cells are clear.  IMPRESSION: Unremarkable brain MRI for age. No evidence of infarct or other acute intracranial abnormality.   Electronically Signed   By: Sebastian Ache   On: 04/28/2013 12:19  I personally reviewed the imaging tests through PACS system I reviewed available ER/hospitalization records through the EMR   ECG interpretation   Date: 04/28/2013  Rate: 67  Rhythm: normal sinus rhythm  QRS Axis: normal  Intervals: normal  ST/T Wave abnormalities: normal  Conduction Disutrbances: none  Narrative Interpretation:   Old EKG Reviewed: No significant changes noted      MDM   1.  Vertigo    1:29 PM Pt feels much better at this time. MRI negative. Suspect peripheral vertigo. Dc home in good condition    I personally performed the services described in this documentation, which was scribed in my presence. The recorded information has been reviewed and is accurate.      Lyanne Co, MD 04/28/13 1330  Lyanne Co, MD 04/28/13 [id number]

## 2013-04-28 NOTE — ED Notes (Signed)
Pt co dizziness and HTN today, pt has diabetic neuropathy and took an extra trileptal and neurontin.

## 2013-06-08 ENCOUNTER — Encounter: Payer: Self-pay | Admitting: Podiatry

## 2013-06-08 ENCOUNTER — Ambulatory Visit (INDEPENDENT_AMBULATORY_CARE_PROVIDER_SITE_OTHER): Payer: Medicare Other | Admitting: Podiatry

## 2013-06-08 VITALS — BP 155/79 | HR 74 | Resp 12

## 2013-06-08 DIAGNOSIS — B351 Tinea unguium: Secondary | ICD-10-CM

## 2013-06-08 DIAGNOSIS — M79609 Pain in unspecified limb: Secondary | ICD-10-CM

## 2013-06-08 NOTE — Progress Notes (Signed)
   Subjective:    Patient ID: Paula [name], female    DOB: 09/04/1940, 73 y.o.   MRN: [id number]  HPI Comments: '' TOENAILS TRIM.''     Review of Systems     Objective:   Physical Exam: Vital signs are stable she is alert and oriented x3. Her nails are thick yellow dystrophic clinically mycotic and painful on palpation.        Assessment & Plan:  Assessment: Pain in limb secondary to onychomycosis 1 through 5 bilateral.  Plan: Discussed etiology pathology conservative versus surgical therapies. Debridement nails 1 through 5 bilateral is cover service secondary to pain followup with her in 3 months.

## 2013-06-29 ENCOUNTER — Ambulatory Visit: Payer: Medicare Other | Admitting: Neurology

## 2013-06-29 ENCOUNTER — Telehealth: Payer: Self-pay | Admitting: Neurology

## 2013-06-29 NOTE — Telephone Encounter (Signed)
Patient called to cancel appointment due to being sick with the flu. She will call back to reschedule when she feels better.

## 2013-07-01 ENCOUNTER — Encounter (HOSPITAL_COMMUNITY): Payer: Self-pay | Admitting: Emergency Medicine

## 2013-07-01 ENCOUNTER — Emergency Department (HOSPITAL_COMMUNITY): Payer: Medicare Other

## 2013-07-01 ENCOUNTER — Inpatient Hospital Stay (HOSPITAL_COMMUNITY)
Admission: EM | Admit: 2013-07-01 | Discharge: 2013-07-05 | DRG: 193 | Disposition: A | Discharge status: Home / Self Care | Payer: Medicare Other | Attending: Internal Medicine | Admitting: Internal Medicine

## 2013-07-01 DIAGNOSIS — I951 Orthostatic hypotension: Secondary | ICD-10-CM

## 2013-07-01 DIAGNOSIS — F329 Major depressive disorder, single episode, unspecified: Secondary | ICD-10-CM | POA: Diagnosis present

## 2013-07-01 DIAGNOSIS — J9600 Acute respiratory failure, unspecified whether with hypoxia or hypercapnia: Secondary | ICD-10-CM | POA: Diagnosis present

## 2013-07-01 DIAGNOSIS — F3289 Other specified depressive episodes: Secondary | ICD-10-CM | POA: Diagnosis present

## 2013-07-01 DIAGNOSIS — R0902 Hypoxemia: Secondary | ICD-10-CM

## 2013-07-01 DIAGNOSIS — K219 Gastro-esophageal reflux disease without esophagitis: Secondary | ICD-10-CM | POA: Diagnosis present

## 2013-07-01 DIAGNOSIS — Z7982 Long term (current) use of aspirin: Secondary | ICD-10-CM

## 2013-07-01 DIAGNOSIS — E119 Type 2 diabetes mellitus without complications: Secondary | ICD-10-CM | POA: Diagnosis present

## 2013-07-01 DIAGNOSIS — I10 Essential (primary) hypertension: Secondary | ICD-10-CM | POA: Diagnosis present

## 2013-07-01 DIAGNOSIS — E871 Hypo-osmolality and hyponatremia: Secondary | ICD-10-CM | POA: Diagnosis present

## 2013-07-01 DIAGNOSIS — J189 Pneumonia, unspecified organism: Principal | ICD-10-CM | POA: Diagnosis present

## 2013-07-01 DIAGNOSIS — E1142 Type 2 diabetes mellitus with diabetic polyneuropathy: Secondary | ICD-10-CM | POA: Diagnosis present

## 2013-07-01 DIAGNOSIS — Z79899 Other long term (current) drug therapy: Secondary | ICD-10-CM

## 2013-07-01 DIAGNOSIS — E860 Dehydration: Secondary | ICD-10-CM

## 2013-07-01 DIAGNOSIS — E1149 Type 2 diabetes mellitus with other diabetic neurological complication: Secondary | ICD-10-CM | POA: Diagnosis present

## 2013-07-01 HISTORY — DX: Dizziness and giddiness: R42

## 2013-07-01 HISTORY — DX: Irritable bowel syndrome, unspecified: K58.9

## 2013-07-01 LAB — BASIC METABOLIC PANEL
BUN: 11 mg/dL (ref 6–23)
CALCIUM: 8.6 mg/dL (ref 8.4–10.5)
CHLORIDE: 88 meq/L — AB (ref 96–112)
CO2: 28 meq/L (ref 19–32)
CREATININE: 0.7 mg/dL (ref 0.50–1.10)
GFR calc non Af Amer: 83 mL/min — ABNORMAL LOW (ref >90)
Glucose, Bld: 164 mg/dL — ABNORMAL HIGH (ref 70–99)
Potassium: 4 mEq/L (ref 3.7–5.3)
Sodium: 132 mEq/L — ABNORMAL LOW (ref 137–147)

## 2013-07-01 LAB — CBC WITH DIFFERENTIAL/PLATELET
BASOS PCT: 0 % (ref 0–1)
Basophils Absolute: 0 10*3/uL (ref 0.0–0.1)
EOS PCT: 1 % (ref 0–5)
Eosinophils Absolute: 0.1 10*3/uL (ref 0.0–0.7)
HEMATOCRIT: 34.2 % — AB (ref 36.0–46.0)
HEMOGLOBIN: 11.6 g/dL — AB (ref 12.0–15.0)
Lymphocytes Relative: 10 % — ABNORMAL LOW (ref 12–46)
Lymphs Abs: 1.3 10*3/uL (ref 0.7–4.0)
MCH: 30.4 pg (ref 26.0–34.0)
MCHC: 33.9 g/dL (ref 30.0–36.0)
MCV: 89.5 fL (ref 78.0–100.0)
MONO ABS: 1.4 10*3/uL — AB (ref 0.1–1.0)
MONOS PCT: 12 % (ref 3–12)
NEUTROS ABS: 9.5 10*3/uL — AB (ref 1.7–7.7)
Neutrophils Relative %: 77 % (ref 43–77)
Platelets: 210 10*3/uL (ref 150–400)
RBC: 3.82 MIL/uL — ABNORMAL LOW (ref 3.87–5.11)
RDW: 12.5 % (ref 11.5–15.5)
WBC: 12.3 10*3/uL — ABNORMAL HIGH (ref 4.0–10.5)

## 2013-07-01 LAB — TROPONIN I: Troponin I: <0.3 ng/mL (ref <0.30)

## 2013-07-01 LAB — INFLUENZA PANEL BY PCR (TYPE A & B)
H1N1 flu by pcr: NOT DETECTED
Influenza A By PCR: NEGATIVE
Influenza B By PCR: NEGATIVE

## 2013-07-01 LAB — LACTIC ACID, PLASMA: Lactic Acid, Venous: 2.6 mmol/L — ABNORMAL HIGH (ref 0.5–2.2)

## 2013-07-01 LAB — HEMOGLOBIN A1C
HEMOGLOBIN A1C: 6.9 % — AB (ref <5.7)
Mean Plasma Glucose: 151 mg/dL — ABNORMAL HIGH (ref <117)

## 2013-07-01 LAB — GLUCOSE, CAPILLARY
GLUCOSE-CAPILLARY: 203 mg/dL — AB (ref 70–99)
Glucose-Capillary: 119 mg/dL — ABNORMAL HIGH (ref 70–99)

## 2013-07-01 MED ORDER — OXCARBAZEPINE 300 MG PO TABS
300.0000 mg | ORAL_TABLET | Freq: Three times a day (TID) | ORAL | Status: DC
  Administered 2013-07-01 – 2013-07-05 (×11): 300 mg via ORAL
  Filled 2013-07-01 (×12): qty 1

## 2013-07-01 MED ORDER — OXCARBAZEPINE 300 MG PO TABS: 300.0000 mg | ORAL_TABLET | Freq: Four times a day (QID) | ORAL | Status: DC

## 2013-07-01 MED ORDER — OSELTAMIVIR PHOSPHATE 75 MG PO CAPS
75.0000 mg | ORAL_CAPSULE | Freq: Every day | ORAL | Status: DC
  Administered 2013-07-01: 75 mg via ORAL
  Filled 2013-07-01 (×2): qty 1

## 2013-07-01 MED ORDER — SODIUM CHLORIDE 0.9 % IV SOLN: INTRAVENOUS | Status: DC

## 2013-07-01 MED ORDER — SODIUM CHLORIDE 0.9 % IV SOLN
INTRAVENOUS | Status: DC
  Administered 2013-07-01 – 2013-07-02 (×2): via INTRAVENOUS

## 2013-07-01 MED ORDER — OXCARBAZEPINE 300 MG PO TABS
600.0000 mg | ORAL_TABLET | Freq: Every day | ORAL | Status: DC
  Administered 2013-07-01 – 2013-07-04 (×4): 600 mg via ORAL
  Filled 2013-07-01 (×4): qty 2

## 2013-07-01 MED ORDER — ENOXAPARIN SODIUM 40 MG/0.4ML ~~LOC~~ SOLN
40.0000 mg | SUBCUTANEOUS | Status: DC
  Administered 2013-07-01 – 2013-07-04 (×4): 40 mg via SUBCUTANEOUS
  Filled 2013-07-01 (×4): qty 0.4

## 2013-07-01 MED ORDER — SODIUM CHLORIDE 0.9 % IV SOLN
INTRAVENOUS | Status: DC
  Administered 2013-07-01 (×2): via INTRAVENOUS

## 2013-07-01 MED ORDER — ALBUTEROL SULFATE (2.5 MG/3ML) 0.083% IN NEBU: 2.5000 mg | INHALATION_SOLUTION | RESPIRATORY_TRACT | Status: DC

## 2013-07-01 MED ORDER — IPRATROPIUM-ALBUTEROL 0.5-2.5 (3) MG/3ML IN SOLN
3.0000 mL | Freq: Once | RESPIRATORY_TRACT | Status: AC
  Administered 2013-07-01: 3 mL via RESPIRATORY_TRACT
  Filled 2013-07-01: qty 3

## 2013-07-01 MED ORDER — BENAZEPRIL HCL 10 MG PO TABS
10.0000 mg | ORAL_TABLET | Freq: Every day | ORAL | Status: DC
  Administered 2013-07-02 – 2013-07-05 (×4): 10 mg via ORAL
  Filled 2013-07-01 (×4): qty 1

## 2013-07-01 MED ORDER — IPRATROPIUM-ALBUTEROL 0.5-2.5 (3) MG/3ML IN SOLN
3.0000 mL | RESPIRATORY_TRACT | Status: DC
  Administered 2013-07-01 – 2013-07-05 (×22): 3 mL via RESPIRATORY_TRACT
  Filled 2013-07-01 (×22): qty 3

## 2013-07-01 MED ORDER — INSULIN ASPART 100 UNIT/ML ~~LOC~~ SOLN
0.0000 [IU] | Freq: Three times a day (TID) | SUBCUTANEOUS | Status: DC
  Administered 2013-07-02: 2 [IU] via SUBCUTANEOUS
  Administered 2013-07-02 – 2013-07-03 (×3): 3 [IU] via SUBCUTANEOUS
  Administered 2013-07-03 – 2013-07-04 (×3): 2 [IU] via SUBCUTANEOUS
  Administered 2013-07-04: 3 [IU] via SUBCUTANEOUS
  Administered 2013-07-05: 2 [IU] via SUBCUTANEOUS

## 2013-07-01 MED ORDER — LEVOFLOXACIN IN D5W 750 MG/150ML IV SOLN
750.0000 mg | Freq: Once | INTRAVENOUS | Status: AC
  Administered 2013-07-01: 750 mg via INTRAVENOUS
  Filled 2013-07-01: qty 150

## 2013-07-01 MED ORDER — PANTOPRAZOLE SODIUM 40 MG PO TBEC
40.0000 mg | DELAYED_RELEASE_TABLET | Freq: Every day | ORAL | Status: DC
  Administered 2013-07-01 – 2013-07-05 (×5): 40 mg via ORAL
  Filled 2013-07-01 (×5): qty 1

## 2013-07-01 MED ORDER — GABAPENTIN 400 MG PO CAPS
800.0000 mg | ORAL_CAPSULE | Freq: Four times a day (QID) | ORAL | Status: DC
  Administered 2013-07-01 – 2013-07-05 (×16): 800 mg via ORAL
  Filled 2013-07-01 (×16): qty 2

## 2013-07-01 MED ORDER — GUAIFENESIN 100 MG/5ML PO SOLN
200.0000 mg | ORAL | Status: DC | PRN
  Administered 2013-07-01 – 2013-07-04 (×3): 200 mg via ORAL
  Filled 2013-07-01 (×6): qty 5
  Filled 2013-07-01: qty 10

## 2013-07-01 MED ORDER — FLUTICASONE PROPIONATE 50 MCG/ACT NA SUSP
2.0000 | Freq: Every day | NASAL | Status: DC
  Administered 2013-07-01 – 2013-07-05 (×5): 2 via NASAL
  Filled 2013-07-01: qty 16

## 2013-07-01 MED ORDER — SODIUM CHLORIDE 0.9 % IV BOLUS (SEPSIS)
250.0000 mL | Freq: Once | INTRAVENOUS | Status: AC
  Administered 2013-07-01: 250 mL via INTRAVENOUS

## 2013-07-01 MED ORDER — ALBUTEROL SULFATE (2.5 MG/3ML) 0.083% IN NEBU
2.5000 mg | INHALATION_SOLUTION | Freq: Once | RESPIRATORY_TRACT | Status: AC
  Administered 2013-07-01: 2.5 mg via RESPIRATORY_TRACT
  Filled 2013-07-01: qty 3

## 2013-07-01 MED ORDER — ASPIRIN EC 81 MG PO TBEC
81.0000 mg | DELAYED_RELEASE_TABLET | Freq: Every day | ORAL | Status: DC
  Administered 2013-07-02 – 2013-07-05 (×4): 81 mg via ORAL
  Filled 2013-07-01 (×4): qty 1

## 2013-07-01 MED ORDER — DEXTROSE 5 % IV SOLN
1.0000 g | Freq: Three times a day (TID) | INTRAVENOUS | Status: DC
  Administered 2013-07-01 – 2013-07-05 (×12): 1 g via INTRAVENOUS
  Filled 2013-07-01 (×13): qty 1

## 2013-07-01 MED ORDER — DESIPRAMINE HCL 25 MG PO TABS
50.0000 mg | ORAL_TABLET | Freq: Three times a day (TID) | ORAL | Status: DC
  Administered 2013-07-01 – 2013-07-05 (×12): 50 mg via ORAL
  Filled 2013-07-01: qty 2
  Filled 2013-07-01: qty 1
  Filled 2013-07-01 (×2): qty 2
  Filled 2013-07-01: qty 1
  Filled 2013-07-01 (×5): qty 2
  Filled 2013-07-01: qty 1
  Filled 2013-07-01: qty 2

## 2013-07-01 MED ORDER — DIPHENOXYLATE-ATROPINE 2.5-0.025 MG PO TABS: 1.0000 | ORAL_TABLET | ORAL | Status: DC | PRN

## 2013-07-01 MED ORDER — ADULT MULTIVITAMIN W/MINERALS CH
1.0000 | ORAL_TABLET | Freq: Every day | ORAL | Status: DC
  Administered 2013-07-01 – 2013-07-05 (×5): 1 via ORAL
  Filled 2013-07-01 (×5): qty 1

## 2013-07-01 MED ORDER — VANCOMYCIN HCL IN DEXTROSE 750-5 MG/150ML-% IV SOLN
750.0000 mg | Freq: Two times a day (BID) | INTRAVENOUS | Status: DC
  Administered 2013-07-01 – 2013-07-03 (×5): 750 mg via INTRAVENOUS
  Filled 2013-07-01 (×8): qty 150

## 2013-07-01 NOTE — ED Notes (Signed)
Complain of cough, nausea and dizziness that started Monday. States she went to Dr. Felecia ShellingFanta and was sent here to be evaluated

## 2013-07-01 NOTE — Progress Notes (Signed)
Nutrition Brief Note  Patient identified on the Malnutrition Screening Tool (MST) Report  Wt Readings from Last 15 Encounters:  07/01/13 160 lb (72.576 kg)  04/28/13 170 lb (77.111 kg)  03/16/13 155 lb (70.308 kg)  12/08/12 165 lb (74.844 kg)  12/08/12 165 lb (74.844 kg)  11/02/12 167 lb (75.751 kg)  10/21/11 166 lb (75.297 kg)  03/03/09 171 lb (77.565 kg)  01/17/09 173 lb (78.472 kg)  11/29/08 176 lb (79.833 kg)    Body mass index is 25.84 kg/(m^2). Patient meets criteria for overweight based on current BMI.   Current diet order is carb modified, patient is consuming approximately n/a% of meals at this time. Labs and medications reviewed.   No nutrition interventions warranted at this time. If nutrition issues arise, please consult RD.   Kishana Battey A. Mayford KnifeWilliams, RD, LDN Pager: [phone]

## 2013-07-01 NOTE — ED Notes (Signed)
Report given to Victorino DikeJennifer, RN unit 300.

## 2013-07-01 NOTE — ED Notes (Signed)
Patient was dizzy upon standing. 

## 2013-07-01 NOTE — Progress Notes (Signed)
ANTIBIOTIC CONSULT NOTE - INITIAL  Pharmacy Consult for Vancomycin & Cefepime Indication: pneumonia  Allergies  Allergen Reactions  . Ivp Dye [Iodinated Diagnostic Agents] Other (See Comments)    Patient states "body starts jumping and I pass out"    Patient Measurements: Height: 5\' 6"  (167.6 cm) Weight: 160 lb (72.576 kg) IBW/kg (Calculated) : 59.3  Vital Signs: Temp: 98.1 F (36.7 C) (02/12 1000) Temp src: Oral (02/12 1000) BP: 123/45 mmHg (02/12 1200) Pulse Rate: 78 (02/12 1215) Intake/Output from previous day:   Intake/Output from this shift:    Labs:  Recent Labs  07/01/13 1038  WBC 12.3*  HGB 11.6*  PLT 210  CREATININE 0.70   Estimated Creatinine Clearance: 62.9 ml/min (by C-G formula based on Cr of 0.7). No results found for this basename: VANCOTROUGH, VANCOPEAK, VANCORANDOM, GENTTROUGH, GENTPEAK, GENTRANDOM, TOBRATROUGH, TOBRAPEAK, TOBRARND, AMIKACINPEAK, AMIKACINTROU, AMIKACIN,  in the last 72 hours   Microbiology: No results found for this or any previous visit (from the past 720 hour(s)).  Medical History: Past Medical History  Diagnosis Date  . Hypertension   . Diabetes mellitus without complication   . GERD (gastroesophageal reflux disease)   . Neuropathy   . Vertigo     Medications:  Scheduled:  . sodium chloride   Intravenous STAT  . aspirin EC  81 mg Oral Daily  . [START ON 07/02/2013] benazepril  10 mg Oral Daily  . ceFEPime (MAXIPIME) IV  1 g Intravenous 3 times per day  . desipramine  50 mg Oral TID  . enoxaparin (LOVENOX) injection  40 mg Subcutaneous Q24H  . fluticasone  2 spray Each Nare Daily  . gabapentin  800 mg Oral QID  . insulin aspart  0-15 Units Subcutaneous TID WC  . ipratropium-albuterol  3 mL Nebulization Q4H  . multivitamin with minerals  1 tablet Oral Daily  . oseltamivir  75 mg Oral Daily  . OXcarbazepine  300 mg Oral TID WC   And  . OXcarbazepine  600 mg Oral QHS  . pantoprazole  40 mg Oral Daily    Assessment: 73 yo F presents with shortness of breath & productive cough.  She went to her PCP on Monday & was started on prophylactic dose of Tamiflu.   CXR + RML infiltrate.  Her WBC & lactic acid levels are elevated.   Her husband has been in the hospital so she has spent time there during the past few weeks and is therefore being started on broad-spectrum antibiotics for HCAP.  Renal function is at patient's baseline.   Cefepime 2/12>> Vancomycin 2/12>>  Goal of Therapy:  Vancomycin trough level 15-20 mcg/ml  Plan:  Continue Cefepime 1gm IV q8h Vancomycin 750mg  IV q12h Check Vancomycin trough at steady state Monitor renal function and cx data   Elson ClanLilliston, Rayden Scheper Michelle 07/01/2013,1:34 PM

## 2013-07-01 NOTE — H&P (Signed)
[facility] History and Physical  Paula [name] WUJ:[id number] DOB: 1940-11-24 DOA: 07/01/2013  Referring physician:  PCP: Avon Gully, MD  Specialists:   Chief Complaint: Shortness of breath and cough  HPI: Paula [name] is a 73 y.o. female  With a history of hypertension, diabetes, GERD that presents emergency department for cough and shortness of breath. Patient states her symptoms started approximately 2 weeks ago. Since then she's been in the hospital another healthcare settings with her husband. Her daughter is also noted to have had bronchitis within the last week. Patient has had this cough approximately 2 weeks and states it has been nonproductive. She went to see her primary care physician on Monday and was given Tamiflu. On Tuesday patient began to have a sputum production which was noted to be yellow to green in color. Patient denies any wheezing at this time. She does also complain of some muscle aches and fatigue, denies any fever, recent travel. Patient denies any abdominal pain nausea vomiting or diarrhea at this time.  Review of Systems:  Constitutional: Denies fever, chills, diaphoresis, appetite change and fatigue.  HEENT: Complains of rhinorrhea  and sinus pressure with nasal congestion. Respiratory: Complains of shortness of breath with cough.  Cardiovascular: Denies chest pain, palpitations and leg swelling.  Gastrointestinal: Denies nausea, vomiting, abdominal pain, diarrhea, constipation, blood in stool and abdominal distention.  Genitourinary: Denies dysuria, urgency, frequency, hematuria, flank pain and difficulty urinating.  Musculoskeletal: Denies myalgias, back pain, joint swelling, arthralgias and gait problem.  Skin: Denies pallor, rash and wound.  Neurological: Denies dizziness, seizures, syncope, weakness, light-headedness, numbness and headaches.  Hematological: Denies adenopathy. Easy bruising, personal or family bleeding history    Psychiatric/Behavioral: Denies suicidal ideation, mood changes, confusion, nervousness, sleep disturbance and agitation  Past Medical History  Diagnosis Date  . Hypertension   . Diabetes mellitus without complication   . GERD (gastroesophageal reflux disease)   . Neuropathy   . Vertigo    Past Surgical History  Procedure Laterality Date  . Yag laser application Right 11/10/2012    Procedure: YAG LASER APPLICATION;  Surgeon: Loraine Leriche T. Nile Riggs, MD;  Location: [facility];  Service: Ophthalmology;  Laterality: Right;  . Abdominal hysterectomy    . Back surgery    . Yag laser application Left 12/08/2012    Procedure: YAG LASER APPLICATION;  Surgeon: Loraine Leriche T. Nile Riggs, MD;  Location: [facility];  Service: Ophthalmology;  Laterality: Left;   Social History:  reports that she has never smoked. She has never used smokeless tobacco. She reports that she does not drink alcohol or use illicit drugs. Lives at home.  Allergies  Allergen Reactions  . Ivp Dye [Iodinated Diagnostic Agents] Other (See Comments)    Patient states "body starts jumping and I pass out"    No family history on file.   Prior to Admission medications   Medication Sig Start Date End Date Taking? Authorizing Provider  aspirin 81 MG tablet Take 81 mg by mouth daily.   Yes Historical Provider, MD  benazepril (LOTENSIN) 10 MG tablet Take 10 mg by mouth daily.   Yes Historical Provider, MD  desipramine (NORPRAMIN) 50 MG tablet Take 1 tablet (50 mg total) by mouth 3 (three) times daily. 11/02/12  Yes Levert Feinstein, MD  diphenoxylate-atropine (LOMOTIL) 2.5-0.025 MG per tablet Take 1 tablet by mouth as needed for diarrhea or loose stools.   Yes Historical Provider, MD  esomeprazole (NEXIUM) 40 MG capsule Take 40 mg by mouth 2 (two) times daily.  Yes Historical Provider, MD  estradiol (VIVELLE-DOT) 0.05 MG/24HR Place 1 patch (0.05 mg total) onto the skin once a week. 10/26/12  Yes Tilda Burrow, MD  furosemide (LASIX) 40 MG tablet Take 40 mg by  mouth daily.   Yes Historical Provider, MD  gabapentin (NEURONTIN) 800 MG tablet Take 1 tablet (800 mg total) by mouth 4 (four) times daily. 11/02/12  Yes Levert Feinstein, MD  loratadine (CLARITIN) 10 MG tablet Take 10 mg by mouth daily.   Yes Historical Provider, MD  metFORMIN (GLUCOPHAGE) 500 MG tablet Take 500 mg by mouth 2 (two) times daily with a meal.   Yes Historical Provider, MD  Multiple Vitamin (MULTIVITAMIN) tablet Take 1 tablet by mouth daily.   Yes Historical Provider, MD  oseltamivir (TAMIFLU) 75 MG capsule Take 75 mg by mouth daily. For 10 days.   Yes Historical Provider, MD  oxcarbazepine (TRILEPTAL) 600 MG tablet Take 300-600 mg by mouth 4 (four) times daily. 1/2 tablet three times daily and 1 tablet at night.   Yes Historical Provider, MD   Physical Exam: Filed Vitals:   07/01/13 1215  BP:   Pulse: 78  Temp:   Resp:      General: Well developed, well nourished, NAD, appears stated age  HEENT: NCAT, PERRLA, EOMI, Anicteic Sclera, mucous membranes dry. Neck: Supple, no JVD, no masses  Cardiovascular: S1 S2 auscultated, no rubs, murmurs or gallops. Regular rate and rhythm.  Respiratory: Coarse breath sounds bilaterally, diminished breath sounds noted in the right middle/lower lung lobe.  Abdomen: Soft, nontender, nondistended, + bowel sounds  Extremities: warm dry without cyanosis clubbing or edema  Neuro: AAOx3, cranial nerves grossly intact. Strength 5/5 in patient's upper and lower extremities bilaterally  Skin: Without rashes exudates or nodules  Psych: Normal affect and demeanor with intact judgement and insight  Labs on Admission:  Basic Metabolic Panel:  Recent Labs Lab 07/01/13 1038  NA 132*  K 4.0  CL 88*  CO2 28  GLUCOSE 164*  BUN 11  CREATININE 0.70  CALCIUM 8.6   Liver Function Tests: No results found for this basename: AST, ALT, ALKPHOS, BILITOT, PROT, ALBUMIN,  in the last 168 hours No results found for this basename: LIPASE, AMYLASE,  in the  last 168 hours No results found for this basename: AMMONIA,  in the last 168 hours CBC:  Recent Labs Lab 07/01/13 1038  WBC 12.3*  NEUTROABS 9.5*  HGB 11.6*  HCT 34.2*  MCV 89.5  PLT 210   Cardiac Enzymes:  Recent Labs Lab 07/01/13 1038  TROPONINI <0.30    BNP (last 3 results) No results found for this basename: PROBNP,  in the last 8760 hours CBG: No results found for this basename: GLUCAP,  in the last 168 hours  Radiological Exams on Admission: Dg Chest 2 View  07/01/2013   CLINICAL DATA:  Shortness of breath and cough for 1 week, history hypertension, diabetes, GERD  EXAM: CHEST  2 VIEW  COMPARISON:  11/02/2009  FINDINGS: Enlargement of cardiac silhouette.  Mediastinal contours and pulmonary vascularity normal.  Right basilar infiltrate consistent with pneumonia, new.  Remaining lungs clear.  No pleural effusion or pneumothorax.  Calcified granuloma left lower lobe unchanged.  Bones demineralized.  IMPRESSION: New right basilar infiltrate consistent with pneumonia, likely in right middle lobe on lateral view.   Electronically Signed   By: Ulyses Southward M.D.   On: 07/01/2013 10:22    EKG: Independently reviewed. Sinus rhythm, rate of 81, left axis  deviation  Assessment/Plan Acute hypoxic respiratory failure secondary to HCAP Patient be admitted to medical floor. Patient was noted to have pneumonia on chest x-ray in the right middle lobe. Will place her on oxygen to maintain her oxygen saturations above 92%. Patient was noted to have desaturation into the 80s in the emergency department for nasal cannula was applied. Will place her on vancomycin and cefepime for broad coverage for HCAP as she has been in and out of the hospital and health care settings for approximately 2 weeks. Will obtain a sputum culture as well as Gram stain. Will also obtain urine strep pneumonia as well as Legionella antigens. Will place patient on DuoNeb treatments as well as guaifenesin for her cough. Will  also obtain a flu swab, will continue her Tamiflu empirically.  Hypovolemic hyponatremia as well as chloremia Likely secondary to dehydration. Will place patient on normal saline at 75 cc per hour and continue to monitor her BMP.  Diabetes mellitus type 2 with neuropathy Will hold patient's metformin at this time. Will place her on insulin sliding scale with CBG monitoring. Will continue patient's Trileptal as well as gabapentin for her neuropathy.  Hypertension Will continue patient on benazepril. Will hold her Lasix at this time due to dehydration.  GERD Will continue PPI.  Depression Will continue desipramine.   DVT prophylaxis: Lovenox  Code Status: Full  Condition: Guarded  Family Communication: Daughter at bedside. Admission, patients condition and plan of care including tests being ordered have been discussed with the patient and daughter who indicate understanding and agree with the plan and Code Status.  Disposition Plan: Admitted.     Time spent: 45 minutes  Anubis Fundora D.O. [facility] Pager [phone]  If 7PM-7AM, please contact night-coverage [email or web address] Password [facility] 07/01/2013, 12:25 PM

## 2013-07-01 NOTE — ED Provider Notes (Signed)
CSN: [id number]     Arrival date & time 07/01/13  0941 History   First MD Initiated Contact with Patient 07/01/13 1002     Chief Complaint  Patient presents with  . Shortness of Breath      HPI Pt was seen at 1015. Per pt and her family, c/o gradual onset and worsening of persistent runny/stuffy nose, sinus congestion, cough, nausea, generalized body aches/fatigue for the past 3 days. Pt states she feels the cough is worsening and she "feels dizzy" today. States she was evaluated by her PMD 3 days ago for same, rx tamiflu without relief. Multiple members of pt's family have similar symptoms. Daughter states she is concerned "mom has pneumonia." Denies fevers, no CP/palpitations, no SOB, no abd pain, no vomiting/diarrhea, no rash.    Past Medical History  Diagnosis Date  . Hypertension   . Diabetes mellitus without complication   . GERD (gastroesophageal reflux disease)   . Neuropathy   . Vertigo    Past Surgical History  Procedure Laterality Date  . Yag laser application Right 11/10/2012    Procedure: YAG LASER APPLICATION;  Surgeon: Loraine LericheMark T. Nile RiggsShapiro, MD;  Location: [facility];  Service: Ophthalmology;  Laterality: Right;  . Abdominal hysterectomy    . Back surgery    . Yag laser application Left 12/08/2012    Procedure: YAG LASER APPLICATION;  Surgeon: Loraine LericheMark T. Nile RiggsShapiro, MD;  Location: [facility];  Service: Ophthalmology;  Laterality: Left;    History  Substance Use Topics  . Smoking status: Never Smoker   . Smokeless tobacco: Never Used  . Alcohol Use: No    Review of Systems ROS: Statement: All systems negative except as marked or noted in the HPI; Constitutional: Negative for fever and chills. +generalized weakness/fatigue. ; ; Eyes: Negative for eye pain, redness and discharge. ; ; ENMT: Negative for ear pain, hoarseness, sore throat. +nasal congestion, sinus pressure. ; ; Cardiovascular: Negative for chest pain, palpitations, diaphoresis, dyspnea and peripheral edema. ; ; Respiratory:  +cough. Negative for wheezing and stridor. ; ; Gastrointestinal: +nausea. Negative for vomiting, diarrhea, abdominal pain, blood in stool, hematemesis, jaundice and rectal bleeding. . ; ; Genitourinary: Negative for dysuria, flank pain and hematuria. ; ; Musculoskeletal: Negative for back pain and neck pain. Negative for swelling and trauma.; ; Skin: Negative for pruritus, rash, abrasions, blisters, bruising and skin lesion.; ; Neuro: Negative for headache, lightheadedness and neck stiffness. Negative for altered level of consciousness , altered mental status, extremity weakness, paresthesias, involuntary movement, seizure and syncope.     Allergies  Ivp dye  Home Medications   Current Outpatient Rx  Name  Route  Sig  Dispense  Refill  . aspirin 81 MG tablet   Oral   Take 81 mg by mouth daily.         . benazepril (LOTENSIN) 10 MG tablet   Oral   Take 10 mg by mouth daily.         Marland Kitchen. desipramine (NORPRAMIN) 50 MG tablet   Oral   Take 1 tablet (50 mg total) by mouth 3 (three) times daily.   270 tablet   3   . diazepam (VALIUM) 5 MG tablet   Oral   Take 1 tablet (5 mg total) by mouth every 8 (eight) hours as needed (dizziness).   10 tablet   0   . diphenoxylate-atropine (LOMOTIL) 2.5-0.025 MG per tablet   Oral   Take 1 tablet by mouth as needed for diarrhea or loose stools.         .Marland Kitchen  esomeprazole (NEXIUM) 40 MG capsule   Oral   Take 40 mg by mouth daily.         Marland Kitchen estradiol (VIVELLE-DOT) 0.05 MG/24HR   Transdermal   Place 1 patch (0.05 mg total) onto the skin once a week.   8 patch   12   . furosemide (LASIX) 40 MG tablet   Oral   Take 40 mg by mouth daily.         Marland Kitchen gabapentin (NEURONTIN) 800 MG tablet   Oral   Take 1 tablet (800 mg total) by mouth 4 (four) times daily.   360 tablet   3   . meclizine (ANTIVERT) 12.5 MG tablet   Oral   Take 12.5 mg by mouth 3 (three) times daily as needed for dizziness.          . meclizine (ANTIVERT) 25 MG tablet    Oral   Take 1 tablet (25 mg total) by mouth 3 (three) times daily as needed for dizziness.   15 tablet   0   . metFORMIN (GLUCOPHAGE) 500 MG tablet   Oral   Take 500 mg by mouth 2 (two) times daily with a meal.         . Multiple Vitamin (MULTIVITAMIN) tablet   Oral   Take 1 tablet by mouth daily.         . naproxen sodium (ANAPROX) 220 MG tablet   Oral   Take 220 mg by mouth daily as needed (pain).         . Omega-3 Fatty Acids (FISH OIL PO)   Oral   Take 1 capsule by mouth daily.         Marland Kitchen oxcarbazepine (TRILEPTAL) 600 MG tablet   Oral   Take 1 tablet (600 mg total) by mouth 3 (three) times daily.   270 tablet   3    BP 100/60  Pulse 81  Temp(Src) 98.1 F (36.7 C) (Oral)  Resp 20  Ht 5\' 6"  (1.676 m)  Wt 160 lb (72.576 kg)  BMI 25.84 kg/m2  SpO2 100% Filed Vitals:   07/01/13 1002 07/01/13 1028 07/01/13 1029 07/01/13 1031  BP:  134/63 125/63 100/60  Pulse:  81 82 81  Temp:      TempSrc:      Resp:      Height:      Weight:      SpO2: 100%       Physical Exam 1020: Physical examination:  Nursing notes reviewed; Vital signs and O2 SAT reviewed;  Constitutional: Well developed, Well nourished, In no acute distress; Head:  Normocephalic, atraumatic; Eyes: EOMI, PERRL, No scleral icterus; ENMT: TM's clear bilat. +edemetous nasal turbinates bilat with clear rhinorrhea. Mouth and pharynx normal, Mucous membranes dry; Neck: Supple, Full range of motion, No lymphadenopathy; Cardiovascular: Regular rate and rhythm, No gallop; Respiratory: Breath sounds coarse & equal bilaterally, No wheezes. +moist cough during exam. Speaking full sentences with ease, Normal respiratory effort/excursion; Chest: Nontender, Movement normal; Abdomen: Soft, Nontender, Nondistended, Normal bowel sounds; Genitourinary: No CVA tenderness; Extremities: Pulses normal, No tenderness, No edema, No calf edema or asymmetry.; Neuro: AA&Ox3, Major CN grossly intact.  Speech clear. No gross focal  motor or sensory deficits in extremities.; Skin: Color normal, Warm, Dry.   ED Course  Procedures  EKG Interpretation    Date/Time:  Thursday July 01 2013 10:23:41 EST Ventricular Rate:  81 PR Interval:  200 QRS Duration: 152 QT Interval:  412 QTC Calculation: 478  R Axis:   -49 Text Interpretation:  Normal sinus rhythm Left axis deviation Left ventricular hypertrophy with QRS widening Abnormal ECG When compared with ECG of 28-Apr-2013 09:53, PR interval has decreased Confirmed by [facility]  MD, Nicholos Johns [phone]) on 07/01/2013 10:53:53 AM            MDM  MDM Reviewed: previous chart, nursing note and vitals Reviewed previous: labs and ECG Interpretation: ECG, x-ray and labs   Results for orders placed during the hospital encounter of 07/01/13  CBC WITH DIFFERENTIAL      Result Value Ref Range   WBC 12.3 (*) 4.0 - 10.5 K/uL   RBC 3.82 (*) 3.87 - 5.11 MIL/uL   Hemoglobin 11.6 (*) 12.0 - 15.0 g/dL   HCT 96.0 (*) 45.4 - 09.8 %   MCV 89.5  78.0 - 100.0 fL   MCH 30.4  26.0 - 34.0 pg   MCHC 33.9  30.0 - 36.0 g/dL   RDW 11.9  14.7 - 82.9 %   Platelets 210  150 - 400 K/uL   Neutrophils Relative % 77  43 - 77 %   Neutro Abs 9.5 (*) 1.7 - 7.7 K/uL   Lymphocytes Relative 10 (*) 12 - 46 %   Lymphs Abs 1.3  0.7 - 4.0 K/uL   Monocytes Relative 12  3 - 12 %   Monocytes Absolute 1.4 (*) 0.1 - 1.0 K/uL   Eosinophils Relative 1  0 - 5 %   Eosinophils Absolute 0.1  0.0 - 0.7 K/uL   Basophils Relative 0  0 - 1 %   Basophils Absolute 0.0  0.0 - 0.1 K/uL  BASIC METABOLIC PANEL      Result Value Ref Range   Sodium 132 (*) 137 - 147 mEq/L   Potassium 4.0  3.7 - 5.3 mEq/L   Chloride 88 (*) 96 - 112 mEq/L   CO2 28  19 - 32 mEq/L   Glucose, Bld 164 (*) 70 - 99 mg/dL   BUN 11  6 - 23 mg/dL   Creatinine, Ser 5.62  0.50 - 1.10 mg/dL   Calcium 8.6  8.4 - 13.0 mg/dL   GFR calc non Af Amer 83 (*) >90 mL/min   GFR calc Af Amer >90  >90 mL/min  TROPONIN I      Result Value Ref Range    Troponin I <0.30  <0.30 ng/mL  LACTIC ACID, PLASMA      Result Value Ref Range   Lactic Acid, Venous 2.6 (*) 0.5 - 2.2 mmol/L   Dg Chest 2 View 07/01/2013   CLINICAL DATA:  Shortness of breath and cough for 1 week, history hypertension, diabetes, GERD  EXAM: CHEST  2 VIEW  COMPARISON:  11/02/2009  FINDINGS: Enlargement of cardiac silhouette.  Mediastinal contours and pulmonary vascularity normal.  Right basilar infiltrate consistent with pneumonia, new.  Remaining lungs clear.  No pleural effusion or pneumothorax.  Calcified granuloma left lower lobe unchanged.  Bones demineralized.  IMPRESSION: New right basilar infiltrate consistent with pneumonia, likely in right middle lobe on lateral view.   Electronically Signed   By: Ulyses Southward M.D.   On: 07/01/2013 10:22    1200:  Will tx for CAP with levaquin. Neb given for Sats 85% R/A on arrival, with O2 Sat increasing to 97% R/A.  +orthostatic on VS; judicious IVF given. Will also check influenza panel. Dx and testing d/w pt and family.  Questions answered.  Verb understanding, agreeable to admit.  T/C to  [facility] Dr. Catha Gosselin, case discussed, including:  HPI, pertinent PM/SHx, VS/PE, dx testing, ED course and treatment:  Agreeable to admit, requests to write temporary orders, obtain inpt medical bed to team 1.   Laray Anger, DO 07/03/13 1224

## 2013-07-01 NOTE — ED Notes (Signed)
Radiology took patient for x-ray.  They stated they could not wait for me to do EKG as "they were on the clock".

## 2013-07-02 LAB — GLUCOSE, CAPILLARY
GLUCOSE-CAPILLARY: 117 mg/dL — AB (ref 70–99)
GLUCOSE-CAPILLARY: 141 mg/dL — AB (ref 70–99)
GLUCOSE-CAPILLARY: 148 mg/dL — AB (ref 70–99)
Glucose-Capillary: 177 mg/dL — ABNORMAL HIGH (ref 70–99)

## 2013-07-02 LAB — STREP PNEUMONIAE URINARY ANTIGEN: Strep Pneumo Urinary Antigen: NEGATIVE

## 2013-07-02 LAB — HIV ANTIBODY (ROUTINE TESTING W REFLEX): HIV: NONREACTIVE

## 2013-07-02 MED ORDER — HYDROCODONE-HOMATROPINE 5-1.5 MG/5ML PO SYRP
5.0000 mL | ORAL_SOLUTION | ORAL | Status: DC | PRN
  Administered 2013-07-02 – 2013-07-04 (×11): 5 mL via ORAL
  Filled 2013-07-02 (×11): qty 5

## 2013-07-02 MED ORDER — BENZONATATE 100 MG PO CAPS
100.0000 mg | ORAL_CAPSULE | Freq: Three times a day (TID) | ORAL | Status: DC
  Administered 2013-07-02 – 2013-07-05 (×10): 100 mg via ORAL
  Filled 2013-07-02 (×10): qty 1

## 2013-07-02 NOTE — Progress Notes (Signed)
Subjective: She was admitted last night with pneumonia. This is community-acquired pneumonia. She's feeling better. She's having trouble with her IV. She is still coughing up sputum.  Objective: Vital signs in last 24 hours: Temp:  [97.9 F (36.6 C)-98.6 F (37 C)] 97.9 F (36.6 C) (02/13 0531) Pulse Rate:  [73-87] 86 (02/13 0531) Resp:  [20-22] 20 (02/13 0531) BP: (100-166)/(45-75) 151/75 mmHg (02/13 0531) SpO2:  [85 %-100 %] 97 % (02/13 0739) Weight:  [72.576 kg (160 lb)] 72.576 kg (160 lb) (02/12 1000) Weight change:  Last BM Date: 06/30/13  Intake/Output from previous day: 02/12 0701 - 02/13 0700 In: 450 [I.V.:200; IV Piggyback:250] Out: 1202 [Urine:1202]  PHYSICAL EXAM General appearance: alert, cooperative and no distress Resp: rhonchi bilaterally Cardio: regular rate and rhythm, S1, S2 normal, no murmur, click, rub or gallop GI: soft, non-tender; bowel sounds normal; no masses,  no organomegaly Extremities: extremities normal, atraumatic, no cyanosis or edema  Lab Results:    Basic Metabolic Panel:  Recent Labs  16/10/96 1038  NA 132*  K 4.0  CL 88*  CO2 28  GLUCOSE 164*  BUN 11  CREATININE 0.70  CALCIUM 8.6   Liver Function Tests: No results found for this basename: AST, ALT, ALKPHOS, BILITOT, PROT, ALBUMIN,  in the last 72 hours No results found for this basename: LIPASE, AMYLASE,  in the last 72 hours No results found for this basename: AMMONIA,  in the last 72 hours CBC:  Recent Labs  07/01/13 1038  WBC 12.3*  NEUTROABS 9.5*  HGB 11.6*  HCT 34.2*  MCV 89.5  PLT 210   Cardiac Enzymes:  Recent Labs  07/01/13 1038  TROPONINI <0.30   BNP: No results found for this basename: PROBNP,  in the last 72 hours D-Dimer: No results found for this basename: DDIMER,  in the last 72 hours CBG:  Recent Labs  07/01/13 1613 07/01/13 2127 07/02/13 0810  GLUCAP 119* 203* 177*   Hemoglobin A1C:  Recent Labs  07/01/13 1356  HGBA1C 6.9*    Fasting Lipid Panel: No results found for this basename: CHOL, HDL, LDLCALC, TRIG, CHOLHDL, LDLDIRECT,  in the last 72 hours Thyroid Function Tests: No results found for this basename: TSH, T4TOTAL, FREET4, T3FREE, THYROIDAB,  in the last 72 hours Anemia Panel: No results found for this basename: VITAMINB12, FOLATE, FERRITIN, TIBC, IRON, RETICCTPCT,  in the last 72 hours Coagulation: No results found for this basename: LABPROT, INR,  in the last 72 hours Urine Drug Screen: Drugs of Abuse  No results found for this basename: labopia, cocainscrnur, labbenz, amphetmu, thcu, labbarb    Alcohol Level: No results found for this basename: ETH,  in the last 72 hours Urinalysis: No results found for this basename: COLORURINE, APPERANCEUR, LABSPEC, PHURINE, GLUCOSEU, HGBUR, BILIRUBINUR, KETONESUR, PROTEINUR, UROBILINOGEN, NITRITE, LEUKOCYTESUR,  in the last 72 hours Misc. Labs:  ABGS No results found for this basename: PHART, PCO2, PO2ART, TCO2, HCO3,  in the last 72 hours CULTURES Recent Results (from the past 240 hour(s))  CULTURE, BLOOD (ROUTINE X 2)     Status: None   Collection Time    07/01/13  1:51 PM      Result Value Ref Range Status   Specimen Description BLOOD RIGHT ANTECUBITAL   Final   Special Requests     Final   Value: BOTTLES DRAWN AEROBIC AND ANAEROBIC AEB=10CC ANA=6CC   Culture NO GROWTH 1 DAY   Final   Report Status PENDING   Incomplete  CULTURE, BLOOD (  ROUTINE X 2)     Status: None   Collection Time    07/01/13  1:56 PM      Result Value Ref Range Status   Specimen Description BLOOD RIGHT HAND   Final   Special Requests     Final   Value: BOTTLES DRAWN AEROBIC AND ANAEROBIC AEB=7CC ANA=4CC   Culture NO GROWTH 1 DAY   Final   Report Status PENDING   Incomplete   Studies/Results: Dg Chest 2 View  07/01/2013   CLINICAL DATA:  Shortness of breath and cough for 1 week, history hypertension, diabetes, GERD  EXAM: CHEST  2 VIEW  COMPARISON:  11/02/2009  FINDINGS:  Enlargement of cardiac silhouette.  Mediastinal contours and pulmonary vascularity normal.  Right basilar infiltrate consistent with pneumonia, new.  Remaining lungs clear.  No pleural effusion or pneumothorax.  Calcified granuloma left lower lobe unchanged.  Bones demineralized.  IMPRESSION: New right basilar infiltrate consistent with pneumonia, likely in right middle lobe on lateral view.   Electronically Signed   By: Ulyses Southward M.D.   On: 07/01/2013 10:22    Medications:  Prior to Admission:  Prescriptions prior to admission  Medication Sig Dispense Refill  . aspirin 81 MG tablet Take 81 mg by mouth daily.      . benazepril (LOTENSIN) 10 MG tablet Take 10 mg by mouth daily.      Marland Kitchen desipramine (NORPRAMIN) 50 MG tablet Take 1 tablet (50 mg total) by mouth 3 (three) times daily.  270 tablet  3  . diphenoxylate-atropine (LOMOTIL) 2.5-0.025 MG per tablet Take 1 tablet by mouth as needed for diarrhea or loose stools.      Marland Kitchen esomeprazole (NEXIUM) 40 MG capsule Take 40 mg by mouth 2 (two) times daily.       Marland Kitchen estradiol (VIVELLE-DOT) 0.05 MG/24HR Place 1 patch (0.05 mg total) onto the skin once a week.  8 patch  12  . furosemide (LASIX) 40 MG tablet Take 40 mg by mouth daily.      Marland Kitchen gabapentin (NEURONTIN) 800 MG tablet Take 1 tablet (800 mg total) by mouth 4 (four) times daily.  360 tablet  3  . loratadine (CLARITIN) 10 MG tablet Take 10 mg by mouth daily.      . metFORMIN (GLUCOPHAGE) 500 MG tablet Take 500 mg by mouth 2 (two) times daily with a meal.      . Multiple Vitamin (MULTIVITAMIN) tablet Take 1 tablet by mouth daily.      Marland Kitchen oseltamivir (TAMIFLU) 75 MG capsule Take 75 mg by mouth daily. For 10 days.      Marland Kitchen oxcarbazepine (TRILEPTAL) 600 MG tablet Take 300-600 mg by mouth 4 (four) times daily. 1/2 tablet three times daily and 1 tablet at night.       Scheduled: . aspirin EC  81 mg Oral Daily  . benazepril  10 mg Oral Daily  . ceFEPime (MAXIPIME) IV  1 g Intravenous 3 times per day  .  desipramine  50 mg Oral TID  . enoxaparin (LOVENOX) injection  40 mg Subcutaneous Q24H  . fluticasone  2 spray Each Nare Daily  . gabapentin  800 mg Oral QID  . insulin aspart  0-15 Units Subcutaneous TID WC  . ipratropium-albuterol  3 mL Nebulization Q4H  . multivitamin with minerals  1 tablet Oral Daily  . OXcarbazepine  300 mg Oral TID WC   And  . OXcarbazepine  600 mg Oral QHS  . pantoprazole  40 mg  Oral Daily  . vancomycin  750 mg Intravenous Q12H   Continuous:  XBJ:[id number]:diphenoxylate-atropine, guaiFENesin  Assesment: She hasHCAP. She is being treated appropriately. I think she was dehydrated on admission but she is drinking well now. Active Problems:   CAP (community acquired pneumonia)   Healthcare-associated pneumonia    Plan: Continue his IV antibiotics. Continue other medications. She will have her IV fluids discontinued as long as she can continue to take in oral fluids.    LOS: 1 day   Halsey Persaud L 07/02/2013, 8:57 AM

## 2013-07-02 NOTE — Progress Notes (Signed)
Patient has a dry cough, no relief with robitussin. Dr Juanetta GoslingHawkins notified. New orders for Tessalon pearls and Hycodan.

## 2013-07-03 DIAGNOSIS — E119 Type 2 diabetes mellitus without complications: Secondary | ICD-10-CM | POA: Diagnosis present

## 2013-07-03 LAB — BASIC METABOLIC PANEL
BUN: 8 mg/dL (ref 6–23)
CALCIUM: 8.2 mg/dL — AB (ref 8.4–10.5)
CO2: 27 mEq/L (ref 19–32)
CREATININE: 0.65 mg/dL (ref 0.50–1.10)
Chloride: 93 mEq/L — ABNORMAL LOW (ref 96–112)
GFR, EST NON AFRICAN AMERICAN: 85 mL/min — AB (ref >90)
Glucose, Bld: 202 mg/dL — ABNORMAL HIGH (ref 70–99)
Potassium: 3.6 mEq/L — ABNORMAL LOW (ref 3.7–5.3)
Sodium: 131 mEq/L — ABNORMAL LOW (ref 137–147)

## 2013-07-03 LAB — GLUCOSE, CAPILLARY
GLUCOSE-CAPILLARY: 165 mg/dL — AB (ref 70–99)
GLUCOSE-CAPILLARY: 115 mg/dL — AB (ref 70–99)
Glucose-Capillary: 158 mg/dL — ABNORMAL HIGH (ref 70–99)
Glucose-Capillary: 142 mg/dL — ABNORMAL HIGH (ref 70–99)

## 2013-07-03 LAB — VANCOMYCIN, TROUGH: Vancomycin Tr: 6.6 ug/mL — ABNORMAL LOW (ref 10.0–20.0)

## 2013-07-03 MED ORDER — VANCOMYCIN HCL IN DEXTROSE 1-5 GM/200ML-% IV SOLN
1000.0000 mg | Freq: Two times a day (BID) | INTRAVENOUS | Status: DC
  Administered 2013-07-04 – 2013-07-05 (×3): 1000 mg via INTRAVENOUS
  Filled 2013-07-03 (×3): qty 200

## 2013-07-03 NOTE — Progress Notes (Signed)
Persistent cough no wheezes or rhonchi noted suspect upper resp virus not flu, aches pains from coughing.  Sputum color unknown PT states mucus color what ever that is -- no clear answer after questioning her.

## 2013-07-03 NOTE — Progress Notes (Signed)
Subjective: She was admitted with healthcare associated pneumonia. She had significant trouble with cough yesterday but that's improved. She is coughing up sputum. She complains of diffuse abdominal and chest pain that she relates to her vigorous cough effort.  Objective: Vital signs in last 24 hours: Temp:  [98.2 F (36.8 C)-98.5 F (36.9 C)] 98.5 F (36.9 C) (02/14 0510) Pulse Rate:  [87] 87 (02/13 2009) Resp:  [20] 20 (02/14 0510) BP: (140-149)/(64-74) 146/66 mmHg (02/14 0510) SpO2:  [90 %-96 %] 90 % (02/14 0726) Weight change:  Last BM Date: 07/02/13  Intake/Output from previous day: 02/13 0701 - 02/14 0700 In: 1020 [P.O.:720; IV Piggyback:300] Out: 2400 [Urine:2400]  PHYSICAL EXAM General appearance: alert, cooperative and mild distress Resp: rhonchi bilaterally Cardio: regular rate and rhythm, S1, S2 normal, no murmur, click, rub or gallop GI: soft, non-tender; bowel sounds normal; no masses,  no organomegaly Extremities: extremities normal, atraumatic, no cyanosis or edema  Lab Results:    Basic Metabolic Panel:  Recent Labs  16/02/9601/12/15 1038 07/03/13 0541  NA 132* 131*  K 4.0 3.6*  CL 88* 93*  CO2 28 27  GLUCOSE 164* 202*  BUN 11 8  CREATININE 0.70 0.65  CALCIUM 8.6 8.2*   Liver Function Tests: No results found for this basename: AST, ALT, ALKPHOS, BILITOT, PROT, ALBUMIN,  in the last 72 hours No results found for this basename: LIPASE, AMYLASE,  in the last 72 hours No results found for this basename: AMMONIA,  in the last 72 hours CBC:  Recent Labs  07/01/13 1038  WBC 12.3*  NEUTROABS 9.5*  HGB 11.6*  HCT 34.2*  MCV 89.5  PLT 210   Cardiac Enzymes:  Recent Labs  07/01/13 1038  TROPONINI <0.30   BNP: No results found for this basename: PROBNP,  in the last 72 hours D-Dimer: No results found for this basename: DDIMER,  in the last 72 hours CBG:  Recent Labs  07/01/13 2127 07/02/13 0810 07/02/13 1107 07/02/13 1640 07/02/13 2122  07/03/13 0805  GLUCAP 203* 177* 117* 141* 148* 158*   Hemoglobin A1C:  Recent Labs  07/01/13 1356  HGBA1C 6.9*   Fasting Lipid Panel: No results found for this basename: CHOL, HDL, LDLCALC, TRIG, CHOLHDL, LDLDIRECT,  in the last 72 hours Thyroid Function Tests: No results found for this basename: TSH, T4TOTAL, FREET4, T3FREE, THYROIDAB,  in the last 72 hours Anemia Panel: No results found for this basename: VITAMINB12, FOLATE, FERRITIN, TIBC, IRON, RETICCTPCT,  in the last 72 hours Coagulation: No results found for this basename: LABPROT, INR,  in the last 72 hours Urine Drug Screen: Drugs of Abuse  No results found for this basename: labopia, cocainscrnur, labbenz, amphetmu, thcu, labbarb    Alcohol Level: No results found for this basename: ETH,  in the last 72 hours Urinalysis: No results found for this basename: COLORURINE, APPERANCEUR, LABSPEC, PHURINE, GLUCOSEU, HGBUR, BILIRUBINUR, KETONESUR, PROTEINUR, UROBILINOGEN, NITRITE, LEUKOCYTESUR,  in the last 72 hours Misc. Labs:  ABGS No results found for this basename: PHART, PCO2, PO2ART, TCO2, HCO3,  in the last 72 hours CULTURES Recent Results (from the past 240 hour(s))  CULTURE, BLOOD (ROUTINE X 2)     Status: None   Collection Time    07/01/13  1:51 PM      Result Value Ref Range Status   Specimen Description BLOOD RIGHT ANTECUBITAL   Final   Special Requests     Final   Value: BOTTLES DRAWN AEROBIC AND ANAEROBIC AEB=10CC ANA=6CC  Culture NO GROWTH 2 DAYS   Final   Report Status PENDING   Incomplete  CULTURE, BLOOD (ROUTINE X 2)     Status: None   Collection Time    07/01/13  1:56 PM      Result Value Ref Range Status   Specimen Description BLOOD RIGHT HAND   Final   Special Requests     Final   Value: BOTTLES DRAWN AEROBIC AND ANAEROBIC AEB=7CC ANA=4CC   Culture NO GROWTH 2 DAYS   Final   Report Status PENDING   Incomplete   Studies/Results: Dg Chest 2 View  07/01/2013   CLINICAL DATA:  Shortness of  breath and cough for 1 week, history hypertension, diabetes, GERD  EXAM: CHEST  2 VIEW  COMPARISON:  11/02/2009  FINDINGS: Enlargement of cardiac silhouette.  Mediastinal contours and pulmonary vascularity normal.  Right basilar infiltrate consistent with pneumonia, new.  Remaining lungs clear.  No pleural effusion or pneumothorax.  Calcified granuloma left lower lobe unchanged.  Bones demineralized.  IMPRESSION: New right basilar infiltrate consistent with pneumonia, likely in right middle lobe on lateral view.   Electronically Signed   By: Ulyses Southward M.D.   On: 07/01/2013 10:22    Medications:  Prior to Admission:  Prescriptions prior to admission  Medication Sig Dispense Refill  . aspirin 81 MG tablet Take 81 mg by mouth daily.      . benazepril (LOTENSIN) 10 MG tablet Take 10 mg by mouth daily.      Marland Kitchen desipramine (NORPRAMIN) 50 MG tablet Take 1 tablet (50 mg total) by mouth 3 (three) times daily.  270 tablet  3  . diphenoxylate-atropine (LOMOTIL) 2.5-0.025 MG per tablet Take 1 tablet by mouth as needed for diarrhea or loose stools.      Marland Kitchen esomeprazole (NEXIUM) 40 MG capsule Take 40 mg by mouth 2 (two) times daily.       Marland Kitchen estradiol (VIVELLE-DOT) 0.05 MG/24HR Place 1 patch (0.05 mg total) onto the skin once a week.  8 patch  12  . furosemide (LASIX) 40 MG tablet Take 40 mg by mouth daily.      Marland Kitchen gabapentin (NEURONTIN) 800 MG tablet Take 1 tablet (800 mg total) by mouth 4 (four) times daily.  360 tablet  3  . loratadine (CLARITIN) 10 MG tablet Take 10 mg by mouth daily.      . metFORMIN (GLUCOPHAGE) 500 MG tablet Take 500 mg by mouth 2 (two) times daily with a meal.      . Multiple Vitamin (MULTIVITAMIN) tablet Take 1 tablet by mouth daily.      Marland Kitchen oseltamivir (TAMIFLU) 75 MG capsule Take 75 mg by mouth daily. For 10 days.      Marland Kitchen oxcarbazepine (TRILEPTAL) 600 MG tablet Take 300-600 mg by mouth 4 (four) times daily. 1/2 tablet three times daily and 1 tablet at night.       Scheduled: . aspirin  EC  81 mg Oral Daily  . benazepril  10 mg Oral Daily  . benzonatate  100 mg Oral TID  . ceFEPime (MAXIPIME) IV  1 g Intravenous 3 times per day  . desipramine  50 mg Oral TID  . enoxaparin (LOVENOX) injection  40 mg Subcutaneous Q24H  . fluticasone  2 spray Each Nare Daily  . gabapentin  800 mg Oral QID  . insulin aspart  0-15 Units Subcutaneous TID WC  . ipratropium-albuterol  3 mL Nebulization Q4H  . multivitamin with minerals  1 tablet Oral  Daily  . OXcarbazepine  300 mg Oral TID WC   And  . OXcarbazepine  600 mg Oral QHS  . pantoprazole  40 mg Oral Daily  . vancomycin  750 mg Intravenous Q12H   Continuous:  UUV:[id number], guaiFENesin, HYDROcodone-homatropine  Assesment: She has healthcare associated pneumonia and is improving. She has diffuse body aches which I do not think her influenza as she was negative for influenza by PCR I think are related to her cough She has GERD which is stable by history. She has diabetes and she's on insulin and it's fairly well controlled Active Problems:   GERD   Healthcare-associated pneumonia   Type II or unspecified type diabetes mellitus without mention of complication, not stated as uncontrolled    Plan: Continue current IV antibiotics bronchodilators cough medication    LOS: 2 days   Rosanna Bickle L 07/03/2013, 9:55 AM

## 2013-07-03 NOTE — Progress Notes (Signed)
ANTIBIOTIC CONSULT NOTE  Pharmacy Consult for Vancomycin & Cefepime Indication: pneumonia  Allergies  Allergen Reactions  . Ivp Dye [Iodinated Diagnostic Agents] Other (See Comments)    Patient states "body starts jumping and I pass out"    Patient Measurements: Height: 5\' 6"  (167.6 cm) Weight: 160 lb (72.576 kg) IBW/kg (Calculated) : 59.3  Vital Signs: Temp: 98.9 F (37.2 C) (02/14 1558) BP: 147/72 mmHg (02/14 1558) Pulse Rate: 86 (02/14 1558) Intake/Output from previous day: 02/13 0701 - 02/14 0700 In: 1020 [P.O.:720; IV Piggyback:300] Out: 2400 [Urine:2400] Intake/Output from this shift: Total I/O In: 490 [P.O.:240; IV Piggyback:250] Out: 1300 [Urine:1300]  Labs:  Recent Labs  07/01/13 1038 07/03/13 0541  WBC 12.3*  --   HGB 11.6*  --   PLT 210  --   CREATININE 0.70 0.65   Estimated Creatinine Clearance: 62.9 ml/min (by C-G formula based on Cr of 0.65).  Recent Labs  07/03/13 1457  VANCOTROUGH 6.6*     Microbiology: Recent Results (from the past 720 hour(s))  CULTURE, BLOOD (ROUTINE X 2)     Status: None   Collection Time    07/01/13  1:51 PM      Result Value Ref Range Status   Specimen Description BLOOD RIGHT ANTECUBITAL   Final   Special Requests     Final   Value: BOTTLES DRAWN AEROBIC AND ANAEROBIC AEB=10CC ANA=6CC   Culture NO GROWTH 2 DAYS   Final   Report Status PENDING   Incomplete  CULTURE, BLOOD (ROUTINE X 2)     Status: None   Collection Time    07/01/13  1:56 PM      Result Value Ref Range Status   Specimen Description BLOOD RIGHT HAND   Final   Special Requests     Final   Value: BOTTLES DRAWN AEROBIC AND ANAEROBIC AEB=7CC ANA=4CC   Culture NO GROWTH 2 DAYS   Final   Report Status PENDING   Incomplete    Medical History: Past Medical History  Diagnosis Date  . Hypertension   . Diabetes mellitus without complication   . GERD (gastroesophageal reflux disease)   . Neuropathy   . Vertigo   . Irritable bowel syndrome (IBS)      Medications:  Scheduled:  . aspirin EC  81 mg Oral Daily  . benazepril  10 mg Oral Daily  . benzonatate  100 mg Oral TID  . ceFEPime (MAXIPIME) IV  1 g Intravenous 3 times per day  . desipramine  50 mg Oral TID  . enoxaparin (LOVENOX) injection  40 mg Subcutaneous Q24H  . fluticasone  2 spray Each Nare Daily  . gabapentin  800 mg Oral QID  . insulin aspart  0-15 Units Subcutaneous TID WC  . ipratropium-albuterol  3 mL Nebulization Q4H  . multivitamin with minerals  1 tablet Oral Daily  . OXcarbazepine  300 mg Oral TID WC   And  . OXcarbazepine  600 mg Oral QHS  . pantoprazole  40 mg Oral Daily  . vancomycin  750 mg Intravenous Q12H   Assessment: 73 yo F She has healthcare associated pneumonia and is improving.  Trough below goal. Micro pending.  Cefepime 2/12>> Vancomycin 2/12>>  Goal of Therapy:  Vancomycin trough level 15-20 mcg/ml  Plan:  Continue Cefepime 1gm IV q8h Increase Vancomycin 1000mg  IV q12h Repeat Vancomycin trough at steady state Monitor renal function and cx data   Mady GemmaHayes, Anedra Penafiel R 07/03/2013,6:23 PM

## 2013-07-04 LAB — GLUCOSE, CAPILLARY
Glucose-Capillary: 147 mg/dL — ABNORMAL HIGH (ref 70–99)
Glucose-Capillary: 162 mg/dL — ABNORMAL HIGH (ref 70–99)
Glucose-Capillary: 135 mg/dL — ABNORMAL HIGH (ref 70–99)

## 2013-07-04 LAB — LEGIONELLA ANTIGEN, URINE: Legionella Antigen, Urine: NEGATIVE

## 2013-07-04 MED ORDER — SALINE SPRAY 0.65 % NA SOLN
1.0000 | NASAL | Status: DC | PRN
  Filled 2013-07-04: qty 44

## 2013-07-04 NOTE — Progress Notes (Signed)
Subjective: She continues to have cough and congestion. She's complaining of aching and pain that I think is related to coughing  Objective: Vital signs in last 24 hours: Temp:  [98 F (36.7 C)-98.9 F (37.2 C)] 98.2 F (36.8 C) (02/15 0612) Pulse Rate:  [75-86] 75 (02/15 0612) Resp:  [20] 20 (02/15 0612) BP: (147-155)/(72-76) 147/73 mmHg (02/15 0612) SpO2:  [93 %-98 %] 94 % (02/15 0758) Weight change:  Last BM Date: 07/02/13  Intake/Output from previous day: 02/14 0701 - 02/15 0700 In: 970 [P.O.:720; IV Piggyback:250] Out: 2000 [Urine:2000]  PHYSICAL EXAM General appearance: alert, cooperative and mild distress Resp: rhonchi bilaterally Cardio: regular rate and rhythm, S1, S2 normal, no murmur, click, rub or gallop GI: soft, non-tender; bowel sounds normal; no masses,  no organomegaly Extremities: extremities normal, atraumatic, no cyanosis or edema  Lab Results:    Basic Metabolic Panel:  Recent Labs  40/98/11 0541  NA 131*  K 3.6*  CL 93*  CO2 27  GLUCOSE 202*  BUN 8  CREATININE 0.65  CALCIUM 8.2*   Liver Function Tests: No results found for this basename: AST, ALT, ALKPHOS, BILITOT, PROT, ALBUMIN,  in the last 72 hours No results found for this basename: LIPASE, AMYLASE,  in the last 72 hours No results found for this basename: AMMONIA,  in the last 72 hours CBC: No results found for this basename: WBC, NEUTROABS, HGB, HCT, MCV, PLT,  in the last 72 hours Cardiac Enzymes: No results found for this basename: CKTOTAL, CKMB, CKMBINDEX, TROPONINI,  in the last 72 hours BNP: No results found for this basename: PROBNP,  in the last 72 hours D-Dimer: No results found for this basename: DDIMER,  in the last 72 hours CBG:  Recent Labs  07/02/13 2122 07/03/13 0805 07/03/13 1146 07/03/13 1657 07/03/13 2037 07/04/13 0810  GLUCAP 148* 158* 142* 165* 115* 147*   Hemoglobin A1C:  Recent Labs  07/01/13 1356  HGBA1C 6.9*   Fasting Lipid Panel: No  results found for this basename: CHOL, HDL, LDLCALC, TRIG, CHOLHDL, LDLDIRECT,  in the last 72 hours Thyroid Function Tests: No results found for this basename: TSH, T4TOTAL, FREET4, T3FREE, THYROIDAB,  in the last 72 hours Anemia Panel: No results found for this basename: VITAMINB12, FOLATE, FERRITIN, TIBC, IRON, RETICCTPCT,  in the last 72 hours Coagulation: No results found for this basename: LABPROT, INR,  in the last 72 hours Urine Drug Screen: Drugs of Abuse  No results found for this basename: labopia, cocainscrnur, labbenz, amphetmu, thcu, labbarb    Alcohol Level: No results found for this basename: ETH,  in the last 72 hours Urinalysis: No results found for this basename: COLORURINE, APPERANCEUR, LABSPEC, PHURINE, GLUCOSEU, HGBUR, BILIRUBINUR, KETONESUR, PROTEINUR, UROBILINOGEN, NITRITE, LEUKOCYTESUR,  in the last 72 hours Misc. Labs:  ABGS No results found for this basename: PHART, PCO2, PO2ART, TCO2, HCO3,  in the last 72 hours CULTURES Recent Results (from the past 240 hour(s))  CULTURE, BLOOD (ROUTINE X 2)     Status: None   Collection Time    07/01/13  1:51 PM      Result Value Ref Range Status   Specimen Description BLOOD RIGHT ANTECUBITAL   Final   Special Requests     Final   Value: BOTTLES DRAWN AEROBIC AND ANAEROBIC AEB=10CC ANA=6CC   Culture NO GROWTH 3 DAYS   Final   Report Status PENDING   Incomplete  CULTURE, BLOOD (ROUTINE X 2)     Status: None   Collection  Time    07/01/13  1:56 PM      Result Value Ref Range Status   Specimen Description BLOOD RIGHT HAND   Final   Special Requests     Final   Value: BOTTLES DRAWN AEROBIC AND ANAEROBIC AEB=7CC ANA=4CC   Culture NO GROWTH 3 DAYS   Final   Report Status PENDING   Incomplete   Studies/Results: No results found.  Medications:  Prior to Admission:  Prescriptions prior to admission  Medication Sig Dispense Refill  . aspirin 81 MG tablet Take 81 mg by mouth daily.      . benazepril (LOTENSIN) 10 MG  tablet Take 10 mg by mouth daily.      Marland Kitchen. desipramine (NORPRAMIN) 50 MG tablet Take 1 tablet (50 mg total) by mouth 3 (three) times daily.  270 tablet  3  . diphenoxylate-atropine (LOMOTIL) 2.5-0.025 MG per tablet Take 1 tablet by mouth as needed for diarrhea or loose stools.      Marland Kitchen. esomeprazole (NEXIUM) 40 MG capsule Take 40 mg by mouth 2 (two) times daily.       Marland Kitchen. estradiol (VIVELLE-DOT) 0.05 MG/24HR Place 1 patch (0.05 mg total) onto the skin once a week.  8 patch  12  . furosemide (LASIX) 40 MG tablet Take 40 mg by mouth daily.      Marland Kitchen. gabapentin (NEURONTIN) 800 MG tablet Take 1 tablet (800 mg total) by mouth 4 (four) times daily.  360 tablet  3  . loratadine (CLARITIN) 10 MG tablet Take 10 mg by mouth daily.      . metFORMIN (GLUCOPHAGE) 500 MG tablet Take 500 mg by mouth 2 (two) times daily with a meal.      . Multiple Vitamin (MULTIVITAMIN) tablet Take 1 tablet by mouth daily.      Marland Kitchen. oseltamivir (TAMIFLU) 75 MG capsule Take 75 mg by mouth daily. For 10 days.      Marland Kitchen. oxcarbazepine (TRILEPTAL) 600 MG tablet Take 300-600 mg by mouth 4 (four) times daily. 1/2 tablet three times daily and 1 tablet at night.       Scheduled: . aspirin EC  81 mg Oral Daily  . benazepril  10 mg Oral Daily  . benzonatate  100 mg Oral TID  . ceFEPime (MAXIPIME) IV  1 g Intravenous 3 times per day  . desipramine  50 mg Oral TID  . enoxaparin (LOVENOX) injection  40 mg Subcutaneous Q24H  . fluticasone  2 spray Each Nare Daily  . gabapentin  800 mg Oral QID  . insulin aspart  0-15 Units Subcutaneous TID WC  . ipratropium-albuterol  3 mL Nebulization Q4H  . multivitamin with minerals  1 tablet Oral Daily  . OXcarbazepine  300 mg Oral TID WC   And  . OXcarbazepine  600 mg Oral QHS  . pantoprazole  40 mg Oral Daily  . vancomycin  1,000 mg Intravenous Q12H   Continuous:  ZOX:[id number]:diphenoxylate-atropine, guaiFENesin, HYDROcodone-homatropine, sodium chloride  Assesment: She was admitted with healthcare associated  pneumonia and she is improving but it is slowly. Her diabetes is doing okay. She has no other new complaints. Active Problems:   GERD   Healthcare-associated pneumonia   Type II or unspecified type diabetes mellitus without mention of complication, not stated as uncontrolled    Plan: I added a flutter valve. She will continue her other treatments including the IV antibiotics    LOS: 3 days   Paula [name] L 07/04/2013, 11:10 AM

## 2013-07-05 LAB — BASIC METABOLIC PANEL
BUN: 10 mg/dL (ref 6–23)
CHLORIDE: 95 meq/L — AB (ref 96–112)
CO2: 28 meq/L (ref 19–32)
CREATININE: 0.69 mg/dL (ref 0.50–1.10)
Calcium: 8.4 mg/dL (ref 8.4–10.5)
GFR calc Af Amer: >90 mL/min (ref >90)
GFR calc non Af Amer: 84 mL/min — ABNORMAL LOW (ref >90)
Glucose, Bld: 181 mg/dL — ABNORMAL HIGH (ref 70–99)
Potassium: 4.4 mEq/L (ref 3.7–5.3)
Sodium: 135 mEq/L — ABNORMAL LOW (ref 137–147)

## 2013-07-05 LAB — GLUCOSE, CAPILLARY
GLUCOSE-CAPILLARY: 148 mg/dL — AB (ref 70–99)
Glucose-Capillary: 172 mg/dL — ABNORMAL HIGH (ref 70–99)

## 2013-07-05 MED ORDER — LEVOFLOXACIN 500 MG PO TABS: 500.0000 mg | ORAL_TABLET | Freq: Every day | ORAL | Status: DC

## 2013-07-05 MED ORDER — HYDROCODONE-HOMATROPINE 5-1.5 MG/5ML PO SYRP: 5.0000 mL | ORAL_SOLUTION | ORAL | Status: DC | PRN

## 2013-07-05 MED ORDER — BENZONATATE 100 MG PO CAPS: 200.0000 mg | ORAL_CAPSULE | Freq: Three times a day (TID) | ORAL | Status: DC

## 2013-07-05 NOTE — Progress Notes (Signed)
She feels much better and wants to go home. She has no new complaints. She is coughing nonproductively.  Exam shows her temperature 97.7, pulse 75, respirations 20, blood pressure 110/63 and oxygen saturation is 94%. Her chest is much clearer. She still has a cough. Her heart is regular.  Her laboratory work shows her potassium is now up to 4.4. Glucose 181 this morning. I reviewed her medications.  My assessment is that she was admitted with pneumonia and she is ready for discharge. We see discharge summary for details

## 2013-07-05 NOTE — Discharge Summary (Signed)
Physician Discharge Summary  Patient ID: Paula [name] MRN: [id number] DOB/AGE: [birth year] 73 y.o. Primary Care Physician:FANTA,TESFAYE, MD Admit date: 07/01/2013 Discharge date: 07/05/2013    Discharge Diagnoses:   Active Problems:   GERD   Healthcare-associated pneumonia   Type II or unspecified type diabetes mellitus without mention of complication, not stated as uncontrolled     Medication List         aspirin 81 MG tablet  Take 81 mg by mouth daily.     benazepril 10 MG tablet  Commonly known as:  LOTENSIN  Take 10 mg by mouth daily.     benzonatate 100 MG capsule  Commonly known as:  TESSALON  Take 2 capsules (200 mg total) by mouth 3 (three) times daily.     desipramine 50 MG tablet  Commonly known as:  NORPRAMIN  Take 1 tablet (50 mg total) by mouth 3 (three) times daily.     diphenoxylate-atropine 2.5-0.025 MG per tablet  Commonly known as:  LOMOTIL  Take 1 tablet by mouth as needed for diarrhea or loose stools.     esomeprazole 40 MG capsule  Commonly known as:  NEXIUM  Take 40 mg by mouth 2 (two) times daily.     estradiol 0.05 MG/24HR patch  Commonly known as:  VIVELLE-DOT  Place 1 patch (0.05 mg total) onto the skin once a week.     furosemide 40 MG tablet  Commonly known as:  LASIX  Take 40 mg by mouth daily.     gabapentin 800 MG tablet  Commonly known as:  NEURONTIN  Take 1 tablet (800 mg total) by mouth 4 (four) times daily.     HYDROcodone-homatropine 5-1.5 MG/5ML syrup  Commonly known as:  HYCODAN  Take 5 mLs by mouth every 4 (four) hours as needed for cough.     levofloxacin 500 MG tablet  Commonly known as:  LEVAQUIN  Take 1 tablet (500 mg total) by mouth daily.     loratadine 10 MG tablet  Commonly known as:  CLARITIN  Take 10 mg by mouth daily.     metFORMIN 500 MG tablet  Commonly known as:  GLUCOPHAGE  Take 500 mg by mouth 2 (two) times daily with a meal.     multivitamin tablet  Take 1 tablet by mouth daily.     oseltamivir 75 MG capsule  Commonly known as:  TAMIFLU  Take 75 mg by mouth daily. For 10 days.     TRILEPTAL 600 MG tablet  Generic drug:  oxcarbazepine  Take 300-600 mg by mouth 4 (four) times daily. 1/2 tablet three times daily and 1 tablet at night.        Discharged Condition:improved    Consults:none  Significant Diagnostic Studies: Dg Chest 2 View  07/01/2013   CLINICAL DATA:  Shortness of breath and cough for 1 week, history hypertension, diabetes, GERD  EXAM: CHEST  2 VIEW  COMPARISON:  11/02/2009  FINDINGS: Enlargement of cardiac silhouette.  Mediastinal contours and pulmonary vascularity normal.  Right basilar infiltrate consistent with pneumonia, new.  Remaining lungs clear.  No pleural effusion or pneumothorax.  Calcified granuloma left lower lobe unchanged.  Bones demineralized.  IMPRESSION: New right basilar infiltrate consistent with pneumonia, likely in right middle lobe on lateral view.   Electronically Signed   By: Ulyses Southward M.D.   On: 07/01/2013 10:22    Lab Results: Basic Metabolic Panel:  Recent Labs  78/29/56 0541 07/05/13 0643  NA 131* 135*  K 3.6* 4.4  CL 93* 95*  CO2 27 28  GLUCOSE 202* 181*  BUN 8 10  CREATININE 0.65 0.69  CALCIUM 8.2* 8.4   Liver Function Tests: No results found for this basename: AST, ALT, ALKPHOS, BILITOT, PROT, ALBUMIN,  in the last 72 hours   CBC: No results found for this basename: WBC, NEUTROABS, HGB, HCT, MCV, PLT,  in the last 72 hours  Recent Results (from the past 240 hour(s))  CULTURE, BLOOD (ROUTINE X 2)     Status: None   Collection Time    07/01/13  1:51 PM      Result Value Ref Range Status   Specimen Description BLOOD RIGHT ANTECUBITAL   Final   Special Requests     Final   Value: BOTTLES DRAWN AEROBIC AND ANAEROBIC AEB=10CC ANA=6CC   Culture NO GROWTH 3 DAYS   Final   Report Status PENDING   Incomplete  CULTURE, BLOOD (ROUTINE X 2)     Status: None   Collection Time    07/01/13  1:56 PM       Result Value Ref Range Status   Specimen Description BLOOD RIGHT HAND   Final   Special Requests     Final   Value: BOTTLES DRAWN AEROBIC AND ANAEROBIC AEB=7CC ANA=4CC   Culture NO GROWTH 3 DAYS   Final   Report Status PENDING   Incomplete     Hospital Course: She was admitted with pneumonia and is felt to be healthcare associated because of her multiple hospital visits with sick relatives. She was coughing and congested. She was started on appropriate antibiotics and he improved. She continued having trouble with cough and congestion. At the time of discharge she was still coughing but nonproductively was not short of breath had no further chest pain and felt much better. She felt like she was at about 80% of her baseline.  Discharge Exam: Blood pressure 110/63, pulse 75, temperature 97.7 F (36.5 C), temperature source Oral, resp. rate 20, height 5\' 6"  (1.676 m), weight 72.576 kg (160 lb), SpO2 94.00%. She is awake and alert. She still has a cough. Her chest is much clearer. Her heart is regular.  Disposition: Home she did not want home health services       Future Appointments Provider Department Dept Phone   09/07/2013 10:15 AM Max Maud Deed, DPM [facility] [phone]   12/06/2013 2:30 PM Levert Feinstein, MD [facility] [phone]        Signed: Fredirick Maudlin   07/05/2013, 8:36 AM

## 2013-07-05 NOTE — Progress Notes (Signed)
D/c instructions reviewed with patient.  Verbalized understanding. Pt dc'd to home with daughter. Schonewitz, Candelaria StagersLeigh Anne 07/05/2013

## 2013-07-06 LAB — CULTURE, BLOOD (ROUTINE X 2)
CULTURE: NO GROWTH
Culture: NO GROWTH

## 2013-07-13 NOTE — Discharge Summary (Signed)
NAMArdith Dark:  Bakos, Bryony                ACCOUNT NO.:  [id number]  MEDICAL RECORD NO.:  [id number]  LOCATION:  A338                          FACILITY:  [facility]  PHYSICIAN:  Lamarkus Nebel L. Juanetta GoslingHawkins, M.D.DATE OF BIRTH:  [birth year]  DATE OF ADMISSION:  07/01/2013 DATE OF DISCHARGE:  02/16/2015LH                              DISCHARGE SUMMARY   ADDENDUM:  DISCHARGE DIAGNOSIS:  Acute hypoxic respiratory failure.     Phinley Schall L. Juanetta GoslingHawkins, M.D.     ELH/MEDQ  D:  07/13/2013  T:  07/13/2013  Job:  [id number]

## 2013-08-02 ENCOUNTER — Other Ambulatory Visit (HOSPITAL_COMMUNITY): Payer: Self-pay | Admitting: Internal Medicine

## 2013-08-02 ENCOUNTER — Ambulatory Visit (HOSPITAL_COMMUNITY)
Admission: RE | Admit: 2013-08-02 | Discharge: 2013-08-02 | Disposition: A | Discharge status: Home / Self Care | Payer: Medicare Other | Source: Ambulatory Visit | Attending: Internal Medicine | Admitting: Internal Medicine

## 2013-08-02 DIAGNOSIS — Z09 Encounter for follow-up examination after completed treatment for conditions other than malignant neoplasm: Secondary | ICD-10-CM | POA: Insufficient documentation

## 2013-08-02 DIAGNOSIS — J189 Pneumonia, unspecified organism: Secondary | ICD-10-CM | POA: Insufficient documentation

## 2013-08-23 ENCOUNTER — Telehealth: Payer: Self-pay | Admitting: Neurology

## 2013-08-23 ENCOUNTER — Other Ambulatory Visit: Payer: Self-pay | Admitting: Neurology

## 2013-08-23 DIAGNOSIS — R2681 Unsteadiness on feet: Secondary | ICD-10-CM

## 2013-08-23 NOTE — Telephone Encounter (Signed)
Spoke with patient and she is going to have cell phone [phone]) on so that Dr Hosie PoissonSumner may contact her if not able to reach on home phone

## 2013-08-23 NOTE — Telephone Encounter (Signed)
Called, no answer. Will try again

## 2013-08-23 NOTE — Telephone Encounter (Signed)
Called patient back to discuss, will check head CT to rule out posterior fossa stroke.

## 2013-08-23 NOTE — Telephone Encounter (Signed)
Pt called states she is having jerking, feels like her polyneuropathy is bothering her also she saw her PCP and they advised her to see Dr. Terrace ArabiaYan but I advised pt she is out until 09/01/13. Pt would like for Dr. Zannie CoveYan's nurse to call her concerning this matter. Thanks

## 2013-08-23 NOTE — Telephone Encounter (Signed)
Patient calling to check on status, please call back and advise.

## 2013-08-23 NOTE — Telephone Encounter (Signed)
Patient said that the polyneuropathy,wobbly, back of legs hurt,,has loss the feeling in her feet and lower legs,is staggering, started back after being hospitalized for pneumonia for two weeks.  She had started back to riding her bicycle,pushing weights, got into some poison oak outside her house.she has missed no doses of her medicine.Pcp suggested that she consult with her neurologist.

## 2013-08-25 ENCOUNTER — Telehealth: Payer: Self-pay | Admitting: Neurology

## 2013-08-25 NOTE — Telephone Encounter (Signed)
Informed daughter(Deborah) that order had been placed, once approved by insurance, will contact to schedule appt. She verbalized understanding

## 2013-08-25 NOTE — Telephone Encounter (Signed)
Please call patient's daughter, Carleene OverlieDeborah Crumpton, to schedule CT scan--thank you.

## 2013-08-27 ENCOUNTER — Ambulatory Visit (INDEPENDENT_AMBULATORY_CARE_PROVIDER_SITE_OTHER): Payer: Medicare Other | Admitting: Podiatry

## 2013-08-27 ENCOUNTER — Encounter: Payer: Self-pay | Admitting: Podiatry

## 2013-08-27 VITALS — BP 146/86 | HR 82 | Resp 12

## 2013-08-27 DIAGNOSIS — M79609 Pain in unspecified limb: Secondary | ICD-10-CM

## 2013-08-27 DIAGNOSIS — B351 Tinea unguium: Secondary | ICD-10-CM

## 2013-08-27 NOTE — Progress Notes (Signed)
She presents today chief complaint of painful elongated toenails one through 5 bilateral.  Objective: Pulses are palpable bilateral. Nails are thick yellow dystrophic with mycotic and painful palpation as well as debridement.  Assessment: Pain in limb secondary to onychomycosis 1 through 5 bilateral.  Plan: Debridement nails thickness as cover service secondary to pain.

## 2013-09-07 ENCOUNTER — Ambulatory Visit: Payer: Medicare Other | Admitting: Podiatry

## 2013-09-21 ENCOUNTER — Other Ambulatory Visit: Payer: Self-pay | Admitting: Obstetrics and Gynecology

## 2013-10-01 ENCOUNTER — Telehealth: Payer: Self-pay | Admitting: *Deleted

## 2013-10-06 NOTE — Telephone Encounter (Signed)
Called pt to inform her per Dr. Hosie PoissonSumner phone note on 08/23/13 that the CT scan needed to be done to rule out posterior fossa stroke. I advised the pt that if she has any other problems, questions or concerns to call the office. Pt verbalized understanding.

## 2013-10-06 NOTE — Telephone Encounter (Signed)
Patient calling and questioning if CT is necessary?  Please call and advise asap.

## 2013-10-08 ENCOUNTER — Ambulatory Visit
Admission: RE | Admit: 2013-10-08 | Discharge: 2013-10-08 | Disposition: A | Discharge status: Home / Self Care | Payer: Medicare Other | Source: Ambulatory Visit | Attending: Neurology | Admitting: Neurology

## 2013-10-08 DIAGNOSIS — R2681 Unsteadiness on feet: Secondary | ICD-10-CM

## 2013-10-12 ENCOUNTER — Telehealth: Payer: Self-pay | Admitting: *Deleted

## 2013-10-13 NOTE — Telephone Encounter (Signed)
Pt's daughter Stanton Kidney was called about pt's CT scan results. Daughter verbalized understanding.

## 2013-10-15 NOTE — Telephone Encounter (Signed)
Please see note. She is your patient that I had ordered a head CT on while WID. Thanks

## 2013-10-15 NOTE — Telephone Encounter (Signed)
Message copied by Levert Feinstein on Fri Oct 15, 2013 10:29 AM ------      Message from: Demetrios Loll      Created: Wed Oct 13, 2013 11:43 AM       Called pt and spoke with pt's daughter Stanton Kidney informing her per Dr. Hosie Poisson that the pt's CT scan was normal and for the pt to follow up with Dr. Terrace Arabia for further evaluation. Pt's daughter stated that the pt is having dizziness, numbness in feet and a little slurred speech and pt fell yesterday morning. Daughter stated that pt did not want to go to the doctor. Pt's appt was reschedule from 12/06/13 to 02/24/14 and daughter feels that the pt needs to come in to be seen sooner. Please advise ------

## 2013-10-15 NOTE — Telephone Encounter (Signed)
I have talked with her daughter, complains of worsening bilateral feet pain for 2 months, she fell in May 27th 2015 at her house, then went to [facility] for exercise, was noted that she has slow slurred speech, dizziness, more gait difficulty.  I ask her to come in Monday June 1st 9:20am, come with her daughter.

## 2013-10-18 ENCOUNTER — Encounter (INDEPENDENT_AMBULATORY_CARE_PROVIDER_SITE_OTHER): Payer: Self-pay

## 2013-10-18 ENCOUNTER — Encounter: Payer: Self-pay | Admitting: Neurology

## 2013-10-18 ENCOUNTER — Ambulatory Visit (INDEPENDENT_AMBULATORY_CARE_PROVIDER_SITE_OTHER): Payer: Medicare Other | Admitting: Neurology

## 2013-10-18 VITALS — BP 175/87 | HR 79 | Ht 65.0 in | Wt 166.0 lb

## 2013-10-18 DIAGNOSIS — G63 Polyneuropathy in diseases classified elsewhere: Secondary | ICD-10-CM

## 2013-10-18 DIAGNOSIS — E119 Type 2 diabetes mellitus without complications: Secondary | ICD-10-CM

## 2013-10-18 DIAGNOSIS — J189 Pneumonia, unspecified organism: Secondary | ICD-10-CM

## 2013-10-18 MED ORDER — GABAPENTIN 800 MG PO TABS: 800.0000 mg | ORAL_TABLET | Freq: Three times a day (TID) | ORAL | Status: DC

## 2013-10-18 MED ORDER — OXCARBAZEPINE 600 MG PO TABS: ORAL_TABLET | ORAL | Status: DC

## 2013-10-18 MED ORDER — DESIPRAMINE HCL 50 MG PO TABS: 50.0000 mg | ORAL_TABLET | Freq: Three times a day (TID) | ORAL | Status: DC

## 2013-10-18 NOTE — Progress Notes (Signed)
History of Present Illness  HPI: 73 year old Caucasian female, follow up for peripheral neuropathy.  She has past medical history of diabetes for couple of years, well controlled, hyperlipidemia, hypertension presenting with 20 years history of peripheral neuropathy, previously responded transiently to IVIG treatments.   EMG nerve conduction study severe, axonal, length dependent peripheral neuropathy, no evidence of demyelination.  skin biopsy, demonstrated loss of small fibers, but no vasculitis, amyloid deposit.   Lab  evaluation, B12 264, normal CMP, TSH, ANA, double-stranded DNA,  immunofixation protein phoresis, Lyme titer, SSA b, HIV, A1c6.8,  Her main complaint is, ascending numbness, tingling, burning pain, in her feet, leg, and hand.  She is currently taking Neurontin 800 q.i.d.,desipramine  50 t.i.d., Trileptal 600 t.i.d., Mirapex 1 mg every day.  She underwent bilateral L3 laminotomies, foraminotomies to decompress the bilateral L3 as well as L4 nerve roots in Jan, 2012, she recovered well. But Unfortunately, her husband has suffered a stroke, just recently discharged home from rehabilitation. She continues to have bilateral feet and fingertips paresthesia, current combination medications make her symptoms under control, she does complaints of sleepiness, she goes to the gym every day, has lost few pounds  UPDATE June 16th 2014: She is loosing balance easily, continues to complains of feet and hands paresthesia.  She is taking trileptal 600mg  tid, which makes her dizzy, she has to sit down afterwards.  She still exercise everyday,    Her husband had stroke vascular dementia, he was put into assistant living, she visit him regularly, which has caused a lot of emotional turmoil  UPDATE June 1st 2015; She came in with her daughter today, was admitted to the hospital in February 2015 for pneumonia, causing hypoxic respiratory failure, require prolonged recovery, she is now complaining  of worsening bilateral hands, feet paresthesia, burning sensation, difficulty sleeping at nighttime, confusion, excessive daytime sleepiness, her husband stood assistant living, she needs her family member to drive for her 1 hour to visit him, she has difficulty feeling the paddles, worsening gait difficulties and low back pain, urinary urgency, occasionally incontinence,  Her gait difficulty is causing her fell a few times, unsteady, excessive daytime sleepiness  Review of Systems  Out of a complete 14 system review, the patient complains of only the following symptoms, and all other reviewed systems are negative.  Fatigue, ringing ears, drooling, heat intolerance, excessive thirst, flushing, black stool, diarrhea, incontinence of bowel, restless legs, daytime sleepiness, snoring, frequent urination, bladder incontinence, joint pain,: Sweating, walking difficulty, memory loss, as a numbness, speech difficulty, nervousness, anxiety  Neurologic Exam  Mental Status:  Depressed looking elderly female, awake, alert, cooperative to history, talking, and casual conversation. Mini-Mental Status Examination 30 out of 30 Cranial Nerves: CN II-XII pupils were equal round reactive to light.  Extraocular movements were full.  Visual fields were full on confrontational testl  Facial sensation and strength were normal.  Hearing was intact to finger rubbing bilaterally.  Uvula tongue were midling.  Head turning and shoulder shrugging were normal and symmetric.  Tongue protrusion into the cheeks strength were normal. Motor: Normal tone, bulk, and strength, with exception of mild toe extension and flexion weakness. Sensory: Length dependent decreaesd light touch, pinprick to above knee leve, to mid forearm decreased proprioception at toes, and vibratory sensation to knee level. Coordination: Normal finger-to-nose and heel-to-shin.  There was no dysmetria noticed. Gait and Station: wide based and unsteady gait, she has  difficulty perform tiptoe, heel, and tandem walking.  [place]omburg sign: Negative. Reflexes: Deep  tendon reflexes: Bicepts: 2/2, Brachioradialis: 2/2, Triceps: 2/2 Patellar: 1/1 Achilles: absent  Plantar responses were flexor.   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  1, she has developed significant side effects from the medication, including memory trouble, unsteady gait, I will decrease her Trileptal to 600 mg half tablets twice a day during the day, one tablet every night gabapentin to 800 mg 3 times a day,, desipramine 50 mg 3 times a day 2. Physical therapy 3. MRI of lumbar 4. Repeat EMG nerve conduction study 5. If none of the above measures fail to improve her her gait difficulty, unbalance gait, I will proceed with MRI of the brain to rule out central nervous system etiology such as stroke.

## 2013-10-18 NOTE — Telephone Encounter (Signed)
Patient is here seeing Dr.Yan.

## 2013-10-18 NOTE — Patient Instructions (Signed)
Take night time medication as you usually do.  Desipramine 50mg  at 9am after breakfast   Dinner time Trileptal 600mg  1/2 tab at 9am after breakfast 1/2 tab Dinner time   Gabapentin 800mg  at lunch    1 tab at [employer]

## 2013-10-22 ENCOUNTER — Encounter (INDEPENDENT_AMBULATORY_CARE_PROVIDER_SITE_OTHER): Payer: Self-pay

## 2013-10-22 ENCOUNTER — Ambulatory Visit (INDEPENDENT_AMBULATORY_CARE_PROVIDER_SITE_OTHER): Payer: Medicare Other | Admitting: Neurology

## 2013-10-22 DIAGNOSIS — J189 Pneumonia, unspecified organism: Secondary | ICD-10-CM

## 2013-10-22 DIAGNOSIS — G9009 Other idiopathic peripheral autonomic neuropathy: Secondary | ICD-10-CM

## 2013-10-22 DIAGNOSIS — G63 Polyneuropathy in diseases classified elsewhere: Secondary | ICD-10-CM

## 2013-10-22 DIAGNOSIS — E119 Type 2 diabetes mellitus without complications: Secondary | ICD-10-CM

## 2013-10-22 DIAGNOSIS — Z0289 Encounter for other administrative examinations: Secondary | ICD-10-CM

## 2013-10-22 NOTE — Procedures (Signed)
   NCS (NERVE CONDUCTION STUDY) WITH EMG (ELECTROMYOGRAPHY) REPORT   STUDY DATE: June 5th 2015 PATIENT NAME: Paula [name] DOB: 1940-06-11 MRN: [id number]    TECHNOLOGIST: Gearldine Shown ELECTROMYOGRAPHER: Levert Feinstein M.D.  CLINICAL INFORMATION: 73 years old female, with previous history of lumbar decompression surgery, long-standing history of several progressive peripheral neuropathy, now presenting with worsening bilateral feet, hands paresthesia, gait difficulty, falling episodes  FINDINGS: NERVE CONDUCTION STUDY: Bilateral peroneal sensory responses were absent. Bilateral peroneal, tibial motor responses were absent. Right median, ulnar sensory response was absent. Right median motor response showed distal latency 4.7, C. map amplitude 4.6, normal conduction velocity 60 m/s, normal F-wave latency 32.2, msec, right ulnar motor response showed normal distal latency 2.9, C. map amplitude 2.6, F-wave latency 33.0, conduction velocity 51, there was no slowing across right elbow  NEEDLE ELECTROMYOGRAPHY: Selected needle examination was performed at right upper, right lower extremity muscles, right cervical, right lumbosacral paraspinal muscles  Right tibialis anterior, tibialis posterior, medial gastrocnemius, increased sertion activity, 1-2 plus spontaneous activity, enlarged complex motor unit potential, with decreased recruitment patterns  Right vastus lateralis: Normally insertion activity, no spontaneous activity, mixture of normal, and some enlarged motor unit potential, with mildly decreased recruitment pattern  There was a well-healed midline lumbar surgical scar, increased insertion activity, 1-2 plus spontaneous activity at right lumbar paraspinal muscles, right L4, L5, S1.  There was chronic neuropathic changes involving right first dorsal interossei, pronator teres, extensor digital communis, biceps, triceps. There was no spontaneous activity at right cervical paraspinal  muscles, right C4-5-6  IMPRESSION:   This is a marked abnormal study, there is electrodiagnostic evidence of length dependent axonal severe peripheral neuropathy, in addition there is evidence of chronic neuropathic changes involving right cervical myotomes, suggestive of chronic right cervical radiculopathy, and right lumbosacral myotomes, mainly involving right L5, S1, consistent with chronic right lumbar radiculopathies,  In comparison to previous study in June 09 2008, there is continued evidence of slow progression of her peripheral neuropathy.  INTERPRETING PHYSICIAN:   Levert Feinstein M.D. Ph.D. [facility] [address] [place], [place] [address] [phone]

## 2013-10-27 ENCOUNTER — Ambulatory Visit
Admission: RE | Admit: 2013-10-27 | Discharge: 2013-10-27 | Disposition: A | Discharge status: Home / Self Care | Payer: Medicare Other | Source: Ambulatory Visit | Attending: Neurology | Admitting: Neurology

## 2013-10-27 DIAGNOSIS — M545 Low back pain, unspecified: Secondary | ICD-10-CM

## 2013-10-27 DIAGNOSIS — J189 Pneumonia, unspecified organism: Secondary | ICD-10-CM

## 2013-10-27 DIAGNOSIS — E119 Type 2 diabetes mellitus without complications: Secondary | ICD-10-CM

## 2013-10-27 DIAGNOSIS — G63 Polyneuropathy in diseases classified elsewhere: Secondary | ICD-10-CM

## 2013-10-28 NOTE — Telephone Encounter (Signed)
Paula [name]: Please call This patient,  MRI lumbar showed evidence of previous surgery with fusion at L3 and 4, multilevel degenerative disc disease, with mild to moderate foraminal stenosis ( small holes where lumbar nerves coming out of the lumbar spine)  at different levels, I will review film in detail with her at her next visit, no change in treatment plan now   Abnormal MRI lumbar spine (without) demonstrating:  1. Posterior interbody fusion of L3-L4 with pedicle screws and interbody fusion devices. Disc bulging and spondylosis at L2-3, L3-4, L4-5 and L5-S1.  2. At L2-3: disc bulging and facet hypertrophy with moderate right and mild left foraminal stenosis.  3. At L4-5: disc bulging and facet hypertrophy and left laminectomy with mild biforaminal stenosis.

## 2013-11-02 NOTE — Telephone Encounter (Signed)
Spoke to patient's daughter and relayed MRI results, per Dr. Terrace ArabiaYan.  Told her doctor will go over films in detail at her appointment in July.

## 2013-12-03 ENCOUNTER — Ambulatory Visit (INDEPENDENT_AMBULATORY_CARE_PROVIDER_SITE_OTHER): Payer: Medicare Other | Admitting: Neurology

## 2013-12-03 ENCOUNTER — Encounter: Payer: Self-pay | Admitting: Neurology

## 2013-12-03 VITALS — BP 170/79 | HR 73 | Ht 65.0 in | Wt 170.0 lb

## 2013-12-03 DIAGNOSIS — G63 Polyneuropathy in diseases classified elsewhere: Secondary | ICD-10-CM

## 2013-12-03 DIAGNOSIS — G9009 Other idiopathic peripheral autonomic neuropathy: Secondary | ICD-10-CM

## 2013-12-03 NOTE — Progress Notes (Signed)
History of Present Illness  HPI: 73 year old Caucasian female, follow up for peripheral neuropathy.  She has past medical history of diabetes for couple of years, well controlled, hyperlipidemia, hypertension presenting with 20 years history of peripheral neuropathy, previously responded transiently to IVIG treatments twice.  EMG nerve conduction study severe, axonal, length dependent peripheral neuropathy, no evidence of demyelination.  skin biopsy, demonstrated loss of small fibers, but no vasculitis, amyloid deposit.   Lab  evaluation, B12 264, normal CMP, TSH, ANA, double-stranded DNA,  immunofixation protein phoresis, Lyme titer, SSA b, HIV, A1c6.8,  Her main complaint is, ascending numbness, tingling, burning pain, in her feet, leg, and hand.  She is currently taking Neurontin 800 q.i.d.,desipramine  50 t.i.d., Trileptal 600 t.i.d., Mirapex 1 mg every day.  She underwent bilateral L3 laminotomies, foraminotomies to decompress the bilateral L3 as well as L4 nerve roots in Jan, 2012, she recovered well. But Unfortunately, her husband has suffered a stroke, just recently discharged home from rehabilitation. She continues to have bilateral feet and fingertips paresthesia, current combination medications make her symptoms under control, she does complaints of sleepiness, she goes to the gym every day, has lost few pounds  UPDATE June 16th 2014: She is loosing balance easily, continues to complains of feet and hands paresthesia.  She is taking trileptal 600mg  tid, which makes her dizzy, she has to sit down afterwards.  She still exercise everyday,    Her husband had stroke vascular dementia, he was put into assistant living, she visit him regularly, which has caused a lot of emotional turmoil  UPDATE June 1st 2015; She came in with her daughter today, was admitted to the hospital in February 2015 for pneumonia, causing hypoxic respiratory failure, require prolonged recovery, she is now  complaining of worsening bilateral hands, feet paresthesia, burning sensation, difficulty sleeping at nighttime, confusion, excessive daytime sleepiness, her husband stood assistant living, she needs her family member to drive for her 1 hour to visit him, she has difficulty feeling the paddles, worsening gait difficulties and low back pain, urinary urgency, occasionally incontinence,  Her gait difficulty is causing her fell a few times, unsteady, excessive daytime sleepiness  UPDATE July 17th 2015: She overall has improved, repeat electrodiagnostic study has demonstrated  electrodiagnostic evidence of length dependent axonal severe peripheral neuropathy, in addition there is evidence of chronic neuropathic changes involving right cervical myotomes, suggestive of chronic right cervical radiculopathy, and right lumbosacral myotomes, mainly involving right L5, S1, consistent with chronic right lumbar radiculopathies. In comparison to previous study in June 09 2008, there is continued evidence of slow progression of her peripheral neuropathy.  Today we have reviewed MRI lumbar spine  1. Posterior interbody fusion of L3-L4 with pedicle screws and interbody fusion devices. Disc bulging and spondylosis at L2-3, L3-4, L4-5 and L5-S1.  2. At L2-3: disc bulging and facet hypertrophy with moderate right and mild left foraminal stenosis.  3. At L4-5: disc bulging and facet hypertrophy and left laminectomy with mild biforaminal stenosis.  She denies significant neck pain, continue to be physically active, but with increased gait difficulty, fall sometimes,   Review of Systems  Out of a complete 14 system review, the patient complains of only the following symptoms, and all other reviewed systems are negative.  Fatigue, ringing ears, drooling, heat intolerance, excessive thirst, flushing, black stool, diarrhea, incontinence of bowel, restless legs, daytime sleepiness, snoring, frequent urination, bladder  incontinence, joint pain,: Sweating, walking difficulty, memory loss, as a numbness, speech difficulty, nervousness, anxiety  Neurologic Exam  Mental Status:  Depressed looking elderly female, awake, alert, cooperative to history, talking, and casual conversation. Mini-Mental Status Examination 30 out of 30 Cranial Nerves: CN II-XII pupils were equal round reactive to light.  Extraocular movements were full.  Visual fields were full on confrontational testl  Facial sensation and strength were normal.  Hearing was intact to finger rubbing bilaterally.  Uvula tongue were midling.  Head turning and shoulder shrugging were normal and symmetric.  Tongue protrusion into the cheeks strength were normal. Motor: Normal tone, bulk, and strength, with exception of mild toe extension and flexion weakness. Sensory: Length dependent decreaesd light touch, pinprick to above knee leve, to mid forearm, decreased proprioception at toes, and vibratory sensation to knee level. Coordination: Normal finger-to-nose and heel-to-shin.  There was no dysmetria noticed. Gait and Station: wide based and unsteady gait, she has difficulty perform tiptoe, heel, and tandem walking.  [place] sign: Negative. Reflexes: Deep tendon reflexes: Bicepts: 2/2, Brachioradialis: 2/2, Triceps: 2/2 Patellar: 1/1 Achilles: absent  Plantar responses were flexor.   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  1, she has developed significant side effects from the medication, including memory trouble, unsteady gait, I will decrease her Trileptal to 600 mg half tablets twice a day during the day, one tablet every night gabapentin to 800 mg 3 times a day,, desipramine 50 mg 3 times a day 2. RTC in 6 months with Eber Jones

## 2013-12-06 ENCOUNTER — Ambulatory Visit: Payer: Medicare Other | Admitting: Neurology

## 2013-12-16 ENCOUNTER — Encounter: Payer: Self-pay | Admitting: Podiatry

## 2013-12-16 ENCOUNTER — Ambulatory Visit (INDEPENDENT_AMBULATORY_CARE_PROVIDER_SITE_OTHER): Payer: Medicare Other | Admitting: Podiatry

## 2013-12-16 DIAGNOSIS — M79673 Pain in unspecified foot: Secondary | ICD-10-CM

## 2013-12-16 DIAGNOSIS — B351 Tinea unguium: Secondary | ICD-10-CM

## 2013-12-16 DIAGNOSIS — M79609 Pain in unspecified limb: Secondary | ICD-10-CM

## 2013-12-16 NOTE — Patient Instructions (Signed)
Diabetes and Foot Care Diabetes may cause you to have problems because of poor blood supply (circulation) to your feet and legs. This may cause the skin on your feet to become thinner, break easier, and heal more slowly. Your skin may become dry, and the skin may peel and crack. You may also have nerve damage in your legs and feet causing decreased feeling in them. You may not notice minor injuries to your feet that could lead to infections or more serious problems. Taking care of your feet is one of the most important things you can do for yourself.  HOME CARE INSTRUCTIONS  Wear shoes at all times, even in the house. Do not go barefoot. Bare feet are easily injured.  Check your feet daily for blisters, cuts, and redness. If you cannot see the bottom of your feet, use a mirror or ask someone for help.  Wash your feet with warm water (do not use hot water) and mild soap. Then pat your feet and the areas between your toes until they are completely dry. Do not soak your feet as this can dry your skin.  Apply a moisturizing lotion or petroleum jelly (that does not contain alcohol and is unscented) to the skin on your feet and to dry, brittle toenails. Do not apply lotion between your toes.  Trim your toenails straight across. Do not dig under them or around the cuticle. File the edges of your nails with an emery board or nail file.  Do not cut corns or calluses or try to remove them with medicine.  Wear clean socks or stockings every day. Make sure they are not too tight. Do not wear knee-high stockings since they may decrease blood flow to your legs.  Wear shoes that fit properly and have enough cushioning. To break in new shoes, wear them for just a few hours a day. This prevents you from injuring your feet. Always look in your shoes before you put them on to be sure there are no objects inside.  Do not cross your legs. This may decrease the blood flow to your feet.  If you find a minor scrape,  cut, or break in the skin on your feet, keep it and the skin around it clean and dry. These areas may be cleansed with mild soap and water. Do not cleanse the area with peroxide, alcohol, or iodine.  When you remove an adhesive bandage, be sure not to damage the skin around it.  If you have a wound, look at it several times a day to make sure it is healing.  Do not use heating pads or hot water bottles. They may burn your skin. If you have lost feeling in your feet or legs, you may not know it is happening until it is too late.  Make sure your health care provider performs a complete foot exam at least annually or more often if you have foot problems. Report any cuts, sores, or bruises to your health care provider immediately. SEEK MEDICAL CARE IF:   You have an injury that is not healing.  You have cuts or breaks in the skin.  You have an ingrown nail.  You notice redness on your legs or feet.  You feel burning or tingling in your legs or feet.  You have pain or cramps in your legs and feet.  Your legs or feet are numb.  Your feet always feel cold. SEEK IMMEDIATE MEDICAL CARE IF:   There is increasing redness,   swelling, or pain in or around a wound.  There is a red line that goes up your leg.  Pus is coming from a wound.  You develop a fever or as directed by your health care provider.  You notice a bad smell coming from an ulcer or wound. Document Released: 05/03/2000 Document Revised: 01/06/2013 Document Reviewed: 10/13/2012 Exit[employer] Patient Information 2015 [employer]. This information is not intended to replace advice given to you by your health care provider. Make sure you discuss any questions you have with your health care provider.  

## 2013-12-16 NOTE — Progress Notes (Signed)
She presents today with a chief complaint of painful elongated toenails.  Objective: Nails are thick yellow dystrophic with mycotic and painful palpation.  Assessment: Pain in limb secondary to onychomycosis 1 through 5 bilateral.  Plan: Debridement of nails 1 through 5 bilateral covered service secondary to pain 

## 2014-01-26 ENCOUNTER — Encounter: Payer: Self-pay | Admitting: Internal Medicine

## 2014-02-07 ENCOUNTER — Other Ambulatory Visit: Payer: Medicare Other | Admitting: Obstetrics and Gynecology

## 2014-02-07 ENCOUNTER — Other Ambulatory Visit (HOSPITAL_COMMUNITY): Payer: Self-pay | Admitting: Internal Medicine

## 2014-02-07 ENCOUNTER — Ambulatory Visit (HOSPITAL_COMMUNITY)
Admission: RE | Admit: 2014-02-07 | Discharge: 2014-02-07 | Disposition: A | Discharge status: Home / Self Care | Payer: Medicare Other | Source: Ambulatory Visit | Attending: Internal Medicine | Admitting: Internal Medicine

## 2014-02-07 DIAGNOSIS — R0602 Shortness of breath: Secondary | ICD-10-CM | POA: Insufficient documentation

## 2014-02-07 DIAGNOSIS — J40 Bronchitis, not specified as acute or chronic: Secondary | ICD-10-CM

## 2014-02-07 DIAGNOSIS — R05 Cough: Secondary | ICD-10-CM | POA: Diagnosis present

## 2014-02-07 DIAGNOSIS — R059 Cough, unspecified: Secondary | ICD-10-CM | POA: Insufficient documentation

## 2014-02-17 ENCOUNTER — Other Ambulatory Visit: Payer: Medicare Other | Admitting: Obstetrics and Gynecology

## 2014-02-21 ENCOUNTER — Encounter: Payer: Self-pay | Admitting: Obstetrics and Gynecology

## 2014-02-21 ENCOUNTER — Ambulatory Visit (INDEPENDENT_AMBULATORY_CARE_PROVIDER_SITE_OTHER): Payer: Medicare Other | Admitting: Obstetrics and Gynecology

## 2014-02-21 VITALS — BP 140/80 | Ht 67.0 in | Wt 176.0 lb

## 2014-02-21 DIAGNOSIS — N3941 Urge incontinence: Secondary | ICD-10-CM

## 2014-02-21 DIAGNOSIS — N951 Menopausal and female climacteric states: Secondary | ICD-10-CM

## 2014-02-21 DIAGNOSIS — Z0000 Encounter for general adult medical examination without abnormal findings: Secondary | ICD-10-CM

## 2014-02-21 DIAGNOSIS — Z01419 Encounter for gynecological examination (general) (routine) without abnormal findings: Secondary | ICD-10-CM

## 2014-02-21 MED ORDER — ESTRADIOL 0.05 MG/24HR TD PTTW: 1.0000 | MEDICATED_PATCH | TRANSDERMAL | Status: DC

## 2014-02-21 MED ORDER — SOLIFENACIN SUCCINATE 5 MG PO TABS: ORAL_TABLET | ORAL | Status: DC

## 2014-02-21 NOTE — Progress Notes (Signed)
Patient ID: Paula [name], female   DOB: 10/02/1940, 73 y.o.   MRN: [id number] Pt here today for annual exam. Pt states that she has to go the bathroom very frequently and she would like to have a refill on the vesicare that she was given at her last visit. Pt denies any other problems or concerns at this time.

## 2014-02-21 NOTE — Progress Notes (Addendum)
Patient ID: Paula [name], female   DOB: 1940/12/29, 73 y.o.   MRN: [id number]  Assessment:  Annual Gyn Exam Incomplete stool evacuation Urinary urgency and incontinence Vasomotor symptoms  Plan:  1. pap smear done, next pap due 3 years 2. return annually or prn 3    Annual mammogram advised Subjective:  Paula [name] is a 73 y.o. female No obstetric history on file. who presents for annual exam. No LMP recorded. Patient has had a hysterectomy. The patient has complains of recurrent urinary frequency.  She would like a refill of her Vesicare to address this issue.  She is also complaining of diarrhea that she is unable to control.  She takes Lomotil with temporary relief to her symptoms.  She has already received her mammogram.    The following portions of the patient's history were reviewed and updated as appropriate: allergies, current medications, past family history, past medical history, past social history, past surgical history and problem list. Past Medical History  Diagnosis Date  . Hypertension   . Diabetes mellitus without complication   . GERD (gastroesophageal reflux disease)   . Neuropathy   . Vertigo   . Irritable bowel syndrome (IBS)     Past Surgical History  Procedure Laterality Date  . Yag laser application Right 11/10/2012    Procedure: YAG LASER APPLICATION;  Surgeon: Loraine Leriche T. Nile Riggs, MD;  Location: [facility];  Service: Ophthalmology;  Laterality: Right;  . Abdominal hysterectomy    . Back surgery    . Yag laser application Left 12/08/2012    Procedure: YAG LASER APPLICATION;  Surgeon: Loraine Leriche T. Nile Riggs, MD;  Location: [facility];  Service: Ophthalmology;  Laterality: Left;    Current outpatient prescriptions:aspirin 81 MG tablet, Take 81 mg by mouth daily., Disp: , Rfl: ;  benazepril (LOTENSIN) 10 MG tablet, Take 10 mg by mouth daily., Disp: , Rfl: ;  benzonatate (TESSALON) 100 MG capsule, Take 2 capsules (200 mg total) by mouth 3 (three) times daily., Disp: 20 capsule,  Rfl: 0;  desipramine (NORPRAMIN) 50 MG tablet, Take 1 tablet (50 mg total) by mouth 3 (three) times daily., Disp: 270 tablet, Rfl: 3 diphenoxylate-atropine (LOMOTIL) 2.5-0.025 MG per tablet, Take 1 tablet by mouth as needed for diarrhea or loose stools., Disp: , Rfl: ;  esomeprazole (NEXIUM) 40 MG capsule, Take 40 mg by mouth 2 (two) times daily. , Disp: , Rfl: ;  estradiol (VIVELLE-DOT) 0.05 MG/24HR, Place 1 patch (0.05 mg total) onto the skin once a week., Disp: 8 patch, Rfl: 12;  furosemide (LASIX) 40 MG tablet, Take 40 mg by mouth daily., Disp: , Rfl:  gabapentin (NEURONTIN) 800 MG tablet, Take 1 tablet (800 mg total) by mouth 3 (three) times daily., Disp: 270 tablet, Rfl: 3;  HYDROcodone-homatropine (HYCODAN) 5-1.5 MG/5ML syrup, Take 5 mLs by mouth every 4 (four) hours as needed for cough., Disp: 120 mL, Rfl: 0;  metFORMIN (GLUCOPHAGE) 500 MG tablet, Take 500 mg by mouth 2 (two) times daily with a meal., Disp: , Rfl:  Multiple Vitamin (MULTIVITAMIN) tablet, Take 1 tablet by mouth daily., Disp: , Rfl: ;  oxcarbazepine (TRILEPTAL) 600 MG tablet, 1/2 tablet in the morning, 1/2 tab at noon and one tab each night, Disp: 180 tablet, Rfl: 3;  Simvastatin (ZOCOR PO), Take by mouth as directed., Disp: , Rfl: ;  VESICARE 5 MG tablet, TAKE ONE TABLET BY MOUTH DAILY., Disp: 30 tablet, Rfl: 6  Review of Systems Constitutional: negative Gastrointestinal: +diarrhea,  Genitourinary: +urinary frequency  Objective:  BP 140/80  Ht 5\' 7"  (1.702 m)  Wt 176 lb (79.833 kg)  BMI 27.56 kg/m2   BMI: Body mass index is 27.56 kg/(m^2).  General Appearance: Alert, appropriate appearance for age. No acute distress HEENT: Grossly normal Neck / Thyroid:  Cardiovascular: RRR; normal S1, S2, no murmur Lungs: CTA bilaterally Back: No CVAT Breast Exam: No dimpling, nipple retraction or discharge. No masses or nodes., Normal to inspection, Normal breast tissue bilaterally and No masses or nodes.No dimpling, nipple retraction  or discharge. Gastrointestinal: Soft, non-tender, no masses or organomegaly Pelvic Exam: External genitalia: normal general appearance vulvar dystrophy developing, assymptomatic at present Urinary system: urethral meatus normal Vaginal: normal mucosa without prolapse or lesions and normal without tenderness, induration or masses Cervix: removed surgically Adnexa: removed surgically Uterus: removed surgically Rectovaginal: normal rectal, no masses Lymphatic Exam: Non-palpable nodes in neck, clavicular, axillary, or inguinal regions  Skin: no rash or abnormalities Neurologic: Normal gait and speech, no tremor  Psychiatric: Alert and oriented, appropriate affect.  Urinalysis:Not done  Christin Bach. MD Pgr [phone] 2:26 PM   This chart was scribed for Tilda Burrow, MD by Carl Best, ED Scribe. This patient was seen in Room 3 and the patient's care was started at 2:26 PM.

## 2014-02-24 ENCOUNTER — Ambulatory Visit: Payer: Medicare Other | Admitting: Neurology

## 2014-02-25 ENCOUNTER — Ambulatory Visit (INDEPENDENT_AMBULATORY_CARE_PROVIDER_SITE_OTHER): Payer: Medicare Other | Admitting: Gastroenterology

## 2014-02-25 ENCOUNTER — Encounter: Payer: Self-pay | Admitting: Gastroenterology

## 2014-02-25 VITALS — BP 140/68 | HR 69 | Temp 98.4°F | Ht 67.0 in | Wt 173.4 lb

## 2014-02-25 DIAGNOSIS — R195 Other fecal abnormalities: Secondary | ICD-10-CM

## 2014-02-25 NOTE — Progress Notes (Signed)
Primary Care Physician:  Avon GullyFANTA,TESFAYE, MD Primary Gastroenterologist:  Dr. Jena Gaussourk    Chief Complaint  Patient presents with  . Diarrhea  . Nausea    HPI:   Paula [name] presents today with a history of GERD and chronic diarrhea. Last colonoscopy in 2009 with changes of ischemic colitis. Prior to this, colonoscopy in 2006 without any evidence of microscopic colitis on biopsies.   Diarrhea comes and goes. Will sometimes go 2-3 days without a BM then will have diarrhea. Starts out going a little bit then increases in frequency then drains in pants. Referred by Dr. Felecia ShellingFanta secondary to acute on chronic diarrhea, nausea. Has had periods of having cyclical diarrhea with nausea, feels weak, then feels better. No rectal bleeding. Gets so weak with the episodes. Good appetite. Purposeful weight loss. Will have 2-4 days without a BM then has a large episode of diarrhea. Eats something sweet which makes her go.   Past Medical History  Diagnosis Date  . Hypertension   . Diabetes mellitus without complication   . GERD (gastroesophageal reflux disease)   . Neuropathy   . Vertigo   . Irritable bowel syndrome (IBS)     Past Surgical History  Procedure Laterality Date  . Yag laser application Right 11/10/2012    Procedure: YAG LASER APPLICATION;  Surgeon: Loraine LericheMark T. Nile RiggsShapiro, MD;  Location: [facility];  Service: Ophthalmology;  Laterality: Right;  . Abdominal hysterectomy    . Back surgery    . Yag laser application Left 12/08/2012    Procedure: YAG LASER APPLICATION;  Surgeon: Loraine LericheMark T. Nile RiggsShapiro, MD;  Location: [facility];  Service: Ophthalmology;  Laterality: Left;  . Esophagogastroduodenoscopy  01/24/2005    Dr. Karilyn Cotaehman :  Nonerosive antral gastritis, otherwise normal esophagogastroduodenoscopy. No evidence of Barrett's esophagus  . Colonoscopy  01/24/2005    Dr. Rehman:Few small diverticula at sigmoid colon. No evidence of colitis or stricture or mass. Random biopsies were taken from the sigmoid colon       looking for microscopic/collagenous colitis.  . Colonoscopy  2009    Dr. Jena Gaussourk: ischemic colitis, sigmoid diverticula    Current Outpatient Prescriptions  Medication Sig Dispense Refill  . aspirin 81 MG tablet Take 81 mg by mouth daily.      . benazepril (LOTENSIN) 10 MG tablet Take 10 mg by mouth daily.      Marland Kitchen. desipramine (NORPRAMIN) 50 MG tablet Take 1 tablet (50 mg total) by mouth 3 (three) times daily.  270 tablet  3  . diphenoxylate-atropine (LOMOTIL) 2.5-0.025 MG per tablet Take 1 tablet by mouth as needed for diarrhea or loose stools.      Marland Kitchen. esomeprazole (NEXIUM) 40 MG capsule Take 40 mg by mouth 2 (two) times daily.       Marland Kitchen. estradiol (VIVELLE-DOT) 0.05 MG/24HR patch Place 1 patch (0.05 mg total) onto the skin once a week.  8 patch  12  . furosemide (LASIX) 40 MG tablet Take 40 mg by mouth daily.      Marland Kitchen. gabapentin (NEURONTIN) 800 MG tablet Take 1 tablet (800 mg total) by mouth 3 (three) times daily.  270 tablet  3  . metFORMIN (GLUCOPHAGE) 500 MG tablet Take 500 mg by mouth 2 (two) times daily with a meal.      . Multiple Vitamin (MULTIVITAMIN) tablet Take 1 tablet by mouth daily.      Marland Kitchen. oxcarbazepine (TRILEPTAL) 600 MG tablet 1/2 tablet in the morning, 1/2 tab at noon and one  tab each night  180 tablet  3  . Simvastatin (ZOCOR PO) Take 10 mg by mouth daily.       . solifenacin (VESICARE) 5 MG tablet TAKE ONE TABLET BY MOUTH DAILY.  30 tablet  12   No current facility-administered medications for this visit.    Allergies as of 02/25/2014 - Review Complete 02/25/2014  Allergen Reaction Noted  . Ivp dye [iodinated diagnostic agents] Other (See Comments) 10/21/2011    Family History  Problem Relation Age of Onset  . Diabetes Mother   . Stroke Mother   . Diabetes Father   . Heart attack Father   . Diabetes Brother   . Hypertension Brother   . Hypertension Brother   . Colon cancer Neg Hx     History   Social History  . Marital Status: Married    Spouse Name: Rayna SextonRalph      Number of Children: 2  . Years of Education: 10 th   Occupational History  . Retired   .     Social History Main Topics  . Smoking status: Never Smoker   . Smokeless tobacco: Never Used  . Alcohol Use: No  . Drug Use: No  . Sexual Activity: Yes    Birth Control/ Protection: Surgical   Other Topics Concern  . Not on file   Social History Narrative   Patient lives at home alone she is married to [place] but he is in a nursing home.   Education 10 th grade.   Retired.   Right handed.   Caffeine two daily    Review of Systems: Gen: more fatigued  CV: Denies chest pain, heart palpitations, peripheral edema, syncope.  Resp: Denies shortness of breath at rest or with exertion. Denies wheezing or cough.  GI: see HPI GU : urinary frequency  MS: peripheral neuropathy Derm: Denies rash, itching, dry skin Psych: short term memory loss Heme: Denies bruising, bleeding, and enlarged lymph nodes.  Physical Exam: BP 140/68  Pulse 69  Temp(Src) 98.4 F (36.9 C) (Oral)  Ht 5\' 7"  (1.702 m)  Wt 173 lb 6.4 oz (78.654 kg)  BMI 27.15 kg/m2 General:   Alert and oriented. Pleasant and cooperative. Well-nourished and well-developed.  Head:  Normocephalic and atraumatic. Eyes:  Without icterus, sclera clear and conjunctiva pink.  Ears:  Normal auditory acuity. Nose:  No deformity, discharge,  or lesions. Mouth:  No deformity or lesions, oral mucosa pink.  Lungs:  Clear to auscultation bilaterally. No wheezes, rales, or rhonchi. No distress.  Heart:  S1, S2 present without murmurs appreciated.  Abdomen:  +BS, soft, non-tender and non-distended. Large AP diameter Rectal:  Deferred  Msk:  Symmetrical without gross deformities. Normal posture. Extremities:  Without edema. Neurologic:  Alert and  oriented x4;  grossly normal neurologically. Skin:  Intact without significant lesions or rashes. Psych:  Alert and cooperative. Normal mood and affect.

## 2014-02-25 NOTE — Patient Instructions (Signed)
1. Start taking supplemental fiber daily such as Metamucil or Benefiber. I have provided samples.  2. Cut out any sugar substitutes for one week and limit sweets. See how you do with your bowel habits after doing this.  3. On the days you are constipated, take 1 capful of Miralax at bedtime. Let's do this every other day as needed.  4. On "bad" diarrhea days, take 2 mg Imodium once in the morning or when noted that you are having a bad day. Try to limit this so you don't become too constipated.  5. Please have blood work done when you are able.   6. We will see you in 4-6 weeks!

## 2014-03-01 ENCOUNTER — Encounter: Payer: Self-pay | Admitting: Gastroenterology

## 2014-03-01 DIAGNOSIS — R195 Other fecal abnormalities: Secondary | ICD-10-CM | POA: Insufficient documentation

## 2014-03-01 NOTE — Assessment & Plan Note (Signed)
73 year old female with history of chronic diarrhea, now with worsening symptoms but appears to be after bouts of constipation. Nausea associated with diarrhea. No rectal bleeding. Last lower GI evaluation in 2009. Question multifactorial with dietary precipitating factors, IBS, doubt occult process.   1. Supplemental fiber daily 2. Limit sweets, cut out sugar substitutes 3. Miralax daily for constipation (to every other day) 4. Imodium prn diarrhea. Limit Lomotil 5. Celiac serologies 6. Return in 4-6 weeks 7. Consider updated colonoscopy if no improvement

## 2014-03-02 LAB — IGA: IGA: 241 mg/dL (ref 69–380)

## 2014-03-03 LAB — TISSUE TRANSGLUTAMINASE, IGA: Tissue Transglutaminase Ab, IgA: 9.1 U/mL (ref <20)

## 2014-03-03 NOTE — Progress Notes (Signed)
cc'ed to pcp °

## 2014-03-16 NOTE — Progress Notes (Signed)
Quick Note:  Negative celiac serologies.  Return as planned. ______

## 2014-03-24 ENCOUNTER — Ambulatory Visit: Payer: Medicare Other | Admitting: Podiatry

## 2014-03-24 ENCOUNTER — Ambulatory Visit (INDEPENDENT_AMBULATORY_CARE_PROVIDER_SITE_OTHER): Payer: Medicare Other | Admitting: Podiatry

## 2014-03-24 DIAGNOSIS — M79673 Pain in unspecified foot: Secondary | ICD-10-CM

## 2014-03-24 DIAGNOSIS — B351 Tinea unguium: Secondary | ICD-10-CM

## 2014-03-24 NOTE — Progress Notes (Signed)
   Subjective:    Patient ID: Paula [name], female    DOB: 05/29/1940, 73 y.o.   MRN: [id number]  HPI  Pt is here for nail debridement   Review of Systems     Objective:   Physical Exam: Pulses are strongly palpable bilateral. Nails are thick yellow dystrophic clinic mycotic and painful palpation.        Assessment & Plan:  Assessment: Pain in limb secondary to onychomycosis 1 through 5 bilateral.  Plan: Discussed etiology pathology conservative versus surgical therapies. Debrided nails 1 through 5 bilateral.

## 2014-04-06 ENCOUNTER — Ambulatory Visit (INDEPENDENT_AMBULATORY_CARE_PROVIDER_SITE_OTHER): Payer: Medicare Other | Admitting: Nurse Practitioner

## 2014-04-06 ENCOUNTER — Encounter: Payer: Self-pay | Admitting: Gastroenterology

## 2014-04-06 VITALS — BP 181/80 | HR 77 | Temp 97.0°F | Ht 67.0 in | Wt 175.0 lb

## 2014-04-06 DIAGNOSIS — K589 Irritable bowel syndrome without diarrhea: Secondary | ICD-10-CM

## 2014-04-06 NOTE — Patient Instructions (Addendum)
1. Continue taking supplemental fiber (Metamucil) daily 2. Continue with the increased fruits/veggies in your diet 3. Drink plenty of water 4. Not due for colonoscopy until 2019 5. Return in 6 months and as needed.

## 2014-04-06 NOTE — Assessment & Plan Note (Signed)
Symptoms improved since increasing dietary fiber and daily Metamucil. Continue current treatment. Continue to monitor symptoms. No concerning lower GI symptoms currently. Follow-up in 6 months for routine visit or prn for any changes or concerns.

## 2014-04-06 NOTE — Progress Notes (Signed)
Referring Provider: Avon GullyFanta, Tesfaye, MD Primary Care Physician:  Avon GullyFANTA,TESFAYE, MD  Primary GI: Dr. Jena Gaussourk  Chief Complaint  Patient presents with  . Follow-up    Loose Stools    HPI:   73 year old female presents for follow-up on worsening chronic diarrhea. Last visit symptoms were felt to be combination dietary factors and IBS. Has had some improvement with fiber and Metamucil and has increased her fiber intake and is noting improvement. Is having a bowel movement once a day, occasionally may skip a day or "if I don't eat right" will skip two days. When the constipation improved so did the diarrhea. Drinks limited water daily but does drink Crystal light approximately 60oz a day. Denies abdominal pain, N/V. No upper GI symptoms. No dysphagia. Blood pressure a little elevated today but patient states she was doing yard work just before coming in and thinks that's the reason.  Past Medical History  Diagnosis Date  . Hypertension   . Diabetes mellitus without complication   . GERD (gastroesophageal reflux disease)   . Neuropathy   . Vertigo   . Irritable bowel syndrome (IBS)     Past Surgical History  Procedure Laterality Date  . Yag laser application Right 11/10/2012    Procedure: YAG LASER APPLICATION;  Surgeon: Loraine LericheMark T. Nile RiggsShapiro, MD;  Location: [facility];  Service: Ophthalmology;  Laterality: Right;  . Abdominal hysterectomy    . Back surgery    . Yag laser application Left 12/08/2012    Procedure: YAG LASER APPLICATION;  Surgeon: Loraine LericheMark T. Nile RiggsShapiro, MD;  Location: [facility];  Service: Ophthalmology;  Laterality: Left;  . Esophagogastroduodenoscopy  01/24/2005    Dr. Karilyn Cotaehman :  Nonerosive antral gastritis, otherwise normal esophagogastroduodenoscopy. No evidence of Barrett's esophagus  . Colonoscopy  01/24/2005    Dr. Rehman:Few small diverticula at sigmoid colon. No evidence of colitis or stricture or mass. Random biopsies were taken from the sigmoid colon      looking for  microscopic/collagenous colitis.  . Colonoscopy  2009    Dr. Jena Gaussourk: ischemic colitis, sigmoid diverticula    Current Outpatient Prescriptions  Medication Sig Dispense Refill  . aspirin 81 MG tablet Take 81 mg by mouth daily.    . benazepril (LOTENSIN) 10 MG tablet Take 10 mg by mouth daily.    Marland Kitchen. desipramine (NORPRAMIN) 50 MG tablet Take 1 tablet (50 mg total) by mouth 3 (three) times daily. 270 tablet 3  . diphenoxylate-atropine (LOMOTIL) 2.5-0.025 MG per tablet Take 1 tablet by mouth as needed for diarrhea or loose stools.    Marland Kitchen. esomeprazole (NEXIUM) 40 MG capsule Take 40 mg by mouth 2 (two) times daily.     Marland Kitchen. estradiol (VIVELLE-DOT) 0.05 MG/24HR patch Place 1 patch (0.05 mg total) onto the skin once a week. 8 patch 12  . furosemide (LASIX) 40 MG tablet Take 40 mg by mouth daily.    Marland Kitchen. gabapentin (NEURONTIN) 800 MG tablet Take 1 tablet (800 mg total) by mouth 3 (three) times daily. 270 tablet 3  . metFORMIN (GLUCOPHAGE) 500 MG tablet Take 500 mg by mouth 2 (two) times daily with a meal.    . Multiple Vitamin (MULTIVITAMIN) tablet Take 1 tablet by mouth daily.    Marland Kitchen. oxcarbazepine (TRILEPTAL) 600 MG tablet 1/2 tablet in the morning, 1/2 tab at noon and one tab each night 180 tablet 3  . Simvastatin (ZOCOR PO) Take 10 mg by mouth daily.     . solifenacin (VESICARE) 5 MG tablet TAKE  ONE TABLET BY MOUTH DAILY. 30 tablet 12   No current facility-administered medications for this visit.    Allergies as of 04/06/2014 - Review Complete 04/06/2014  Allergen Reaction Noted  . Ivp dye [iodinated diagnostic agents] Other (See Comments) 10/21/2011    Family History  Problem Relation Age of Onset  . Diabetes Mother   . Stroke Mother   . Diabetes Father   . Heart attack Father   . Diabetes Brother   . Hypertension Brother   . Hypertension Brother   . Colon cancer Neg Hx     History   Social History  . Marital Status: Married    Spouse Name: Rayna SextonRalph    Number of Children: 2  . Years of  Education: 10 th   Occupational History  . Retired   .     Social History Main Topics  . Smoking status: Never Smoker   . Smokeless tobacco: Never Used  . Alcohol Use: No  . Drug Use: No  . Sexual Activity: Yes    Birth Control/ Protection: Surgical   Other Topics Concern  . None   Social History Narrative   Patient lives at home alone she is married to PalomasRalph but he is in a nursing home.   Education 10 th grade.   Retired.   Right handed.   Caffeine two daily    Review of Systems: Gen: Denies fatigue, weakness.  CV: Denies chest pain, palpitations. Resp: Denies dyspnea. GI: See HPI. Denies vomiting blood and fecal incontinence.   Denies dysphagia or odynophagia. Denies GERD symptoms.  Physical Exam: BP 181/80 mmHg  Pulse 77  Temp(Src) 97 F (36.1 C) (Oral)  Ht 5\' 7"  (1.702 m)  Wt 175 lb (79.379 kg)  BMI 27.40 kg/m2 General:   Alert and oriented. No distress noted. Pleasant and cooperative.  Head:  Normocephalic and atraumatic. Eyes:  Conjuctiva clear without scleral icterus. Mouth:  Oral mucosa pink and moist. Good dentition. No lesions. Heart:  S1, S2 present without murmurs, rubs, or gallops. Regular rate and rhythm. Abdomen:  +BS, soft, non-tender and non-distended. No rebound or guarding. No HSM or masses noted. Msk:  Symmetrical without gross deformities. Normal posture. Extremities:  Without edema. Neurologic:  Alert and  oriented x4;  grossly normal neurologically. Skin:  Intact without significant lesions or rashes. Psych:  Alert and cooperative. Normal mood and affect.   Wynne DustEric Gill, AGNP-C Adult & Gerontological Nurse Practitioner [facility]  04/06/2014 10:11 AM

## 2014-04-29 ENCOUNTER — Telehealth: Payer: Self-pay | Admitting: Obstetrics and Gynecology

## 2014-04-29 DIAGNOSIS — N952 Postmenopausal atrophic vaginitis: Secondary | ICD-10-CM

## 2014-04-29 MED ORDER — ESTROGENS, CONJUGATED 0.625 MG/GM VA CREA: 0.2500 | TOPICAL_CREAM | VAGINAL | Status: DC

## 2014-04-29 NOTE — Telephone Encounter (Signed)
Rx premarin 0.25 gm three x weekly sent to pt

## 2014-04-29 NOTE — Telephone Encounter (Signed)
Pt states pharmacy does not have her RX for Premarin patch, can a RX please be faxed to the pharmacy listed in EPIC. Pt states she is completely out.

## 2014-05-03 MED ORDER — ESTROGENS, CONJUGATED 0.625 MG/GM VA CREA: 0.2500 | TOPICAL_CREAM | VAGINAL | Status: DC

## 2014-05-03 NOTE — Addendum Note (Signed)
Addended by: Tilda BurrowFERGUSON, Farron Lafond V on: 05/03/2014 10:06 AM   Modules accepted: Orders

## 2014-05-10 ENCOUNTER — Telehealth: Payer: Self-pay | Admitting: *Deleted

## 2014-05-10 DIAGNOSIS — N951 Menopausal and female climacteric states: Secondary | ICD-10-CM

## 2014-05-10 NOTE — Telephone Encounter (Signed)
Pt has a years refil sent to her pharmacy in October '15

## 2014-05-11 MED ORDER — ESTRADIOL 0.05 MG/24HR TD PTTW: 1.0000 | MEDICATED_PATCH | TRANSDERMAL | Status: DC

## 2014-05-11 MED ORDER — ESTRADIOL 0.05 MG/24HR TD PTTW: 1.0000 | MEDICATED_PATCH | TRANSDERMAL | Status: AC

## 2014-06-01 ENCOUNTER — Telehealth: Payer: Self-pay | Admitting: Neurology

## 2014-06-01 MED ORDER — DESIPRAMINE HCL 50 MG PO TABS: 50.0000 mg | ORAL_TABLET | Freq: Three times a day (TID) | ORAL | Status: DC

## 2014-06-01 NOTE — Telephone Encounter (Signed)
Patient is calling because she needs a refill of Desipramne called to [facility] at [phone].  Please advise patient.

## 2014-06-01 NOTE — Telephone Encounter (Signed)
Rx has been sent.  I called back.  She is aware.  Asked that we call in one month Rx to local pharmacy while she waits on mail order.  I have done so.

## 2014-06-06 ENCOUNTER — Ambulatory Visit: Payer: Medicare Other | Admitting: Nurse Practitioner

## 2014-06-20 ENCOUNTER — Telehealth: Payer: Self-pay | Admitting: Neurology

## 2014-06-20 MED ORDER — GABAPENTIN 800 MG PO TABS: 800.0000 mg | ORAL_TABLET | Freq: Three times a day (TID) | ORAL | Status: AC

## 2014-06-20 MED ORDER — GABAPENTIN 800 MG PO TABS: 800.0000 mg | ORAL_TABLET | Freq: Three times a day (TID) | ORAL | Status: DC

## 2014-06-20 MED ORDER — OXCARBAZEPINE 600 MG PO TABS: ORAL_TABLET | ORAL | Status: AC

## 2014-06-20 NOTE — Telephone Encounter (Signed)
I called back.  She would like a one month Rx sent to local pharmacy and 90 day supply supply sent to [facility].  Rx's have been sent.

## 2014-06-20 NOTE — Telephone Encounter (Signed)
Pt is calling stating she only has about 3 days left of gabapentin (NEURONTIN) 800 MG tablet.  She needs a Rx asap.  Please call and advise.

## 2014-06-30 ENCOUNTER — Ambulatory Visit: Payer: Medicare Other | Admitting: Podiatry

## 2014-06-30 ENCOUNTER — Other Ambulatory Visit: Payer: Medicare Other

## 2014-06-30 NOTE — Progress Notes (Deleted)
PICKING UP INSERTS  

## 2014-06-30 NOTE — Patient Instructions (Signed)

## 2014-07-14 ENCOUNTER — Ambulatory Visit (INDEPENDENT_AMBULATORY_CARE_PROVIDER_SITE_OTHER): Payer: Medicare Other | Admitting: Podiatrist

## 2014-07-14 ENCOUNTER — Encounter: Payer: Self-pay | Admitting: Podiatrist

## 2014-07-14 DIAGNOSIS — M79676 Pain in unspecified toe(s): Secondary | ICD-10-CM | POA: Diagnosis not present

## 2014-07-14 DIAGNOSIS — B351 Tinea unguium: Secondary | ICD-10-CM

## 2014-07-14 NOTE — Progress Notes (Signed)
HPI:  Patient presents today for follow up of foot and nail care. Denies any new complaints today.  Objective:  Patients chart is reviewed.  Vascular status reveals pedal pulses noted at 2 out of 4 dp and pt bilateral .  Neurological sensation is decreased to [facility] monofilament bilateral.  Patients nails are thickened, discolored, distrophic, friable and brittle with yellow-brown discoloration. Patient subjectively relates they are painful with shoes and with ambulation of bilateral feet.  Assessment:  Symptomatic onychomycosis x 10  Plan:  Discussed treatment options and alternatives.  The symptomatic toenails were debrided through manual an mechanical means without complication.  Return appointment recommended at routine intervals of 3 months

## 2014-08-03 ENCOUNTER — Encounter: Payer: Self-pay | Admitting: Nurse Practitioner

## 2014-08-03 ENCOUNTER — Ambulatory Visit (INDEPENDENT_AMBULATORY_CARE_PROVIDER_SITE_OTHER): Payer: Medicare Other | Admitting: Nurse Practitioner

## 2014-08-03 VITALS — BP 168/81 | HR 74 | Ht 67.0 in | Wt 175.4 lb

## 2014-08-03 DIAGNOSIS — G63 Polyneuropathy in diseases classified elsewhere: Secondary | ICD-10-CM | POA: Diagnosis not present

## 2014-08-03 DIAGNOSIS — E119 Type 2 diabetes mellitus without complications: Secondary | ICD-10-CM

## 2014-08-03 DIAGNOSIS — M5010 Cervical disc disorder with radiculopathy, unspecified cervical region: Secondary | ICD-10-CM | POA: Diagnosis not present

## 2014-08-03 DIAGNOSIS — R269 Unspecified abnormalities of gait and mobility: Secondary | ICD-10-CM | POA: Diagnosis not present

## 2014-08-03 DIAGNOSIS — G9009 Other idiopathic peripheral autonomic neuropathy: Secondary | ICD-10-CM | POA: Diagnosis not present

## 2014-08-03 DIAGNOSIS — M5416 Radiculopathy, lumbar region: Secondary | ICD-10-CM | POA: Insufficient documentation

## 2014-08-03 MED ORDER — DESIPRAMINE HCL 50 MG PO TABS: 50.0000 mg | ORAL_TABLET | Freq: Three times a day (TID) | ORAL | Status: AC

## 2014-08-03 NOTE — Patient Instructions (Addendum)
Continue Trileptal at current dose, does not need refills Need BMP with next lab draw to monitor  Sodium with Trileptal Please fax results to [phone] Continue gabapentin 800 mg 3 times a day Continue Despramine 50mg  3 times daily will refill Use cane at all times when feeling unbalanced Continue exercise routine 3-4 times a week Follow-up in 6-8 months

## 2014-08-03 NOTE — Progress Notes (Signed)
[facility]  PATIENT: Paula [name] DOB: 05/21/1940   REASON FOR VISIT: Follow up for peripheral neuropathy, cervical and lumbar radiculopathies, gait abnormality HISTORY FROM:patient and daughter    HISTORY OF PRESENT ILLNESS: HISTORY:74 year old Caucasian female, follow up for peripheral neuropathy. She has past medical history of diabetes for couple of years, well controlled, hyperlipidemia, hypertension presenting with 20 years history of peripheral neuropathy, previously responded transiently to IVIG treatments twice. EMG nerve conduction study severe, axonal, length dependent peripheral neuropathy, no evidence of demyelination. skin biopsy, demonstrated loss of small fibers, but no vasculitis, amyloid deposit. Lab evaluation, B12 264, normal CMP, TSH, ANA, double-stranded DNA, immunofixation protein phoresis, Lyme titer, SSA b, HIV, A1c6.8, Her main complaint is, ascending numbness, tingling, burning pain, in her feet, leg, and hand.She is currently taking Neurontin 800 q.i.d.,desipramine 50 t.i.d., Trileptal 600 t.i.d., Mirapex 1 mg every day. She underwent bilateral L3 laminotomies, foraminotomies to decompress the bilateral L3 as well as L4 nerve roots in Jan, 2012, she recovered well. But Unfortunately, her husband has suffered a stroke, just recently discharged home from rehabilitation. She continues to have bilateral feet and fingertips paresthesia, current combination medications make her symptoms under control, she does complaints of sleepiness, she goes to the gym every day, has lost few pounds  UPDATE July 17th 2015: She overall has improved, repeat electrodiagnostic study has demonstrated electrodiagnostic evidence of length dependent axonal severe peripheral neuropathy, in addition there is evidence of chronic neuropathic changes involving right cervical myotomes, suggestive of chronic right cervical radiculopathy, and right lumbosacral myotomes,  mainly involving right L5, S1, consistent with chronic right lumbar radiculopathies. In comparison to previous study in June 09 2008, there is continued evidence of slow progression of her peripheral neuropathy. Today we have reviewed MRI lumbar spine  1. Posterior interbody fusion of L3-L4 with pedicle screws and interbody fusion devices. Disc bulging and spondylosis at L2-3, L3-4, L4-5 and L5-S1.  2. At L2-3: disc bulging and facet hypertrophy with moderate right and mild left foraminal stenosis.  3. At L4-5: disc bulging and facet hypertrophy and left laminectomy with mild biforaminal stenosis. She denies significant neck pain, continue to be physically active, but with increased gait difficulty, fall sometimes,  UPDATE: 3/16/16[name], 74 year old female returns for follow-up with her daughter she was last seen by Dr. Terrace ArabiaYan 12/03/2013.she has a history of peripheral neuropathy, EMG nerve conduction shows severe axonal length dependent peripheral neuropathy with skin biopsy showing evidence of loss of small fibers. Her main complaints continue to be numbness and burning pain in her toes and feet. She is also diabetic but reports her blood sugars are in good control she has a history of back surgery in 2012. Repeat EMG nerve conduction done July of last year with evidence of chronic right cervical radiculopathy and consistent with chronic right lumbar radiculopathies. She denies any significant neck pain she [facility] to 4 times a week. Some days she has more balance issues others and will use a cane. Due to memory troubles her Trileptal was decreased at her last visit to 300 mg twice a day and 600 mg at night. Decrease in her medication has been helpful for her memory loss. In addition she remains on gabapentin 800 mg 3 times a day and Desipramine 50 mg 3 times daily. She denies any recent falls. She occasionally has bowel bladder incontinence. She continues to live at home  and her husband is an assisted living facility. She returns  for reevaluation  REVIEW OF SYSTEMS: Full 14 system review of systems performed and notable only for those listed, all others are neg:  Constitutional: neg  Cardiovascular: neg Ear/Nose/Throat: neg  Skin: neg Eyes: neg Respiratory: neg Gastroitestinal: occasional incontinence of bowel and bladder Hematology/Lymphatic: neg  Endocrine: neg Musculoskeletal:Joint pain, back pain walking difficulty Allergy/Immunology: neg Neurological: numbness burning Psychiatric: anxiety Sleep : neg   ALLERGIES: Allergies  Allergen Reactions  . Ivp Dye [Iodinated Diagnostic Agents] Other (See Comments)    Patient states "body starts jumping and I pass out"    HOME MEDICATIONS: Outpatient Prescriptions Prior to Visit  Medication Sig Dispense Refill  . aspirin 81 MG tablet Take 81 mg by mouth daily.    . benazepril (LOTENSIN) 10 MG tablet Take 10 mg by mouth daily.    Marland Kitchen conjugated estrogens (PREMARIN) vaginal cream Place 0.25 Applicatorfuls vaginally 3 (three) times a week. 42.5 g 12  . desipramine (NORPRAMIN) 50 MG tablet Take 1 tablet (50 mg total) by mouth 3 (three) times daily. 90 tablet 1  . esomeprazole (NEXIUM) 40 MG capsule Take 40 mg by mouth 2 (two) times daily.     Marland Kitchen estradiol (VIVELLE-DOT) 0.05 MG/24HR patch Place 1 patch (0.05 mg total) onto the skin once a week. 8 patch 12  . furosemide (LASIX) 40 MG tablet Take 40 mg by mouth daily.    Marland Kitchen gabapentin (NEURONTIN) 800 MG tablet Take 1 tablet (800 mg total) by mouth 3 (three) times daily. 270 tablet 1  . metFORMIN (GLUCOPHAGE) 500 MG tablet Take 500 mg by mouth 2 (two) times daily with a meal.    . Multiple Vitamin (MULTIVITAMIN) tablet Take 1 tablet by mouth daily.    Marland Kitchen oxcarbazepine (TRILEPTAL) 600 MG tablet 1/2 tablet in the morning, 1/2 tab at noon and one tab each night (Patient taking differently: 1/2 tablet in the morning, 1/2 tab at dinner,  and 1 tab each night) 180  tablet 1  . Simvastatin (ZOCOR PO) Take 10 mg by mouth daily.     . solifenacin (VESICARE) 5 MG tablet TAKE ONE TABLET BY MOUTH DAILY. 30 tablet 12  . diphenoxylate-atropine (LOMOTIL) 2.5-0.025 MG per tablet Take 1 tablet by mouth as needed for diarrhea or loose stools.     No facility-administered medications prior to visit.    PAST MEDICAL HISTORY: Past Medical History  Diagnosis Date  . Hypertension   . Diabetes mellitus without complication   . GERD (gastroesophageal reflux disease)   . Neuropathy   . Vertigo   . Irritable bowel syndrome (IBS)     PAST SURGICAL HISTORY: Past Surgical History  Procedure Laterality Date  . Yag laser application Right 11/10/2012    Procedure: YAG LASER APPLICATION;  Surgeon: Loraine Leriche T. Nile Riggs, MD;  Location: [facility];  Service: Ophthalmology;  Laterality: Right;  . Abdominal hysterectomy    . Back surgery    . Yag laser application Left 12/08/2012    Procedure: YAG LASER APPLICATION;  Surgeon: Loraine Leriche T. Nile Riggs, MD;  Location: [facility];  Service: Ophthalmology;  Laterality: Left;  . Esophagogastroduodenoscopy  01/24/2005    Dr. Karilyn Cota :  Nonerosive antral gastritis, otherwise normal esophagogastroduodenoscopy. No evidence of Barrett's esophagus  . Colonoscopy  01/24/2005    Dr. Rehman:Few small diverticula at sigmoid colon. No evidence of colitis or stricture or mass. Random biopsies were taken from the sigmoid colon      looking for microscopic/collagenous colitis.  . Colonoscopy  2009    Dr. Jena Gauss: ischemic  colitis, sigmoid diverticula    FAMILY HISTORY: Family History  Problem Relation Age of Onset  . Diabetes Mother   . Stroke Mother   . Diabetes Father   . Heart attack Father   . Diabetes Brother   . Hypertension Brother   . Hypertension Brother   . Colon cancer Neg Hx     SOCIAL HISTORY: History   Social History  . Marital Status: Married    Spouse Name: Rayna Sexton  . Number of Children: 2  . Years of Education: 10 th   Occupational  History  . Retired   .     Social History Main Topics  . Smoking status: Never Smoker   . Smokeless tobacco: Never Used  . Alcohol Use: No  . Drug Use: No  . Sexual Activity: Yes    Birth Control/ Protection: Surgical   Other Topics Concern  . Not on file   Social History Narrative   Patient lives at home alone she is married to Pepper Pike but he is in a nursing home.   Education 10 th grade.   Retired.   Right handed.   Caffeine two daily     PHYSICAL EXAM  Filed Vitals:   08/03/14 0929  BP: 168/81  Pulse: 74  Height:  (1.702 m)  Weight: 175 lb 6.4 oz (79.561 kg)   Body mass index is 27.47 kg/(m^2).  Generalized: Well developed, in no acute distress  Head: normocephalic and atraumatic,. Oropharynx benign  Neck: Supple, no carotid bruits  Musculoskeletal: No deformity    Neurological examination  Mental Status:  elderly female, awake, alert, cooperative to history, talking, and casual conversation.  Cranial Nerves: CN II-XII pupils were equal round reactive to light. Extraocular movements were full. Visual fields were full on confrontational testl Facial sensation and strength were normal. Hearing was intact to finger rubbing bilaterally. Uvula tongue were midling. Head turning and shoulder shrugging were normal and symmetric. Tongue protrusion into the cheeks strength were normal. Motor: Normal tone, bulk, and strength, with exception of mild toe extension and flexion weakness. Sensory: Length dependent decreased light touch, pinprick to above knee leve, to mid forearm, decreased proprioception at toes, and vibratory sensation to ankle level. Coordination: Normal finger-to-nose and heel-to-shin. There was no dysmetria noticed. Gait and Station: wide based and unsteady gait, she has difficulty perform tiptoe, heel, and tandem walking. Romberg sign: Negative. Reflexes: Deep tendon reflexes: Bicepts: 2/2, Brachioradialis: 2/2, Triceps: 1/1 Patellar: 1/1  Achilles: absent Plantar responses were flexor.   DIAGNOSTIC DATA (LABS, IMAGING, TESTING) - I reviewed patient records, labs, notes, testing and imaging myself where available.  Lab Results  Component Value Date   WBC 12.3* 07/01/2013   HGB 11.6* 07/01/2013   HCT 34.2* 07/01/2013   MCV 89.5 07/01/2013   PLT 210 07/01/2013      Component Value Date/Time   NA 135* 07/05/2013 0643   K 4.4 07/05/2013 0643   CL 95* 07/05/2013 0643   CO2 28 07/05/2013 0643   GLUCOSE 181* 07/05/2013 0643   BUN 10 07/05/2013 0643   CREATININE 0.69 07/05/2013 0643   CALCIUM 8.4 07/05/2013 0643   PROT 7.0 12/01/2006 1250   ALBUMIN 4.2 12/01/2006 1250   AST 68* 12/01/2006 1250   ALT 55* 12/01/2006 1250   ALKPHOS 114 12/01/2006 1250   BILITOT 0.9 12/01/2006 1250   GFRNONAA 84* 07/05/2013 0643   GFRAA >90 07/05/2013 0865    ASSESSMENT AND PLAN  74 y.o. 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 EMG nerve conduction after her last visit shows chronic cervical and lumbar radiculopathies  Continue Trileptal at current dose, does not need refills Need BMP with next lab draw to monitor  Sodium with Trileptal Please fax results to [phone] Continue gabapentin 800 mg 3 times a day Continue Despramine  3 times daily will refill this will help pain For gait abnormality use cane at all times  to prevent falls Continue exercise routine 3-4 times a week for overall health and well being, flexibility Follow-up in 6-8 months Nilda Riggs, [facility], [facility], APRN  [facility] [address] [place], [place] [address] [phone]

## 2014-08-03 NOTE — Progress Notes (Signed)
I have reviewed and agreed above plan. 

## 2014-09-19 ENCOUNTER — Encounter: Payer: Self-pay | Admitting: Internal Medicine

## 2014-10-20 ENCOUNTER — Ambulatory Visit (INDEPENDENT_AMBULATORY_CARE_PROVIDER_SITE_OTHER): Payer: Medicare Other | Admitting: Podiatry

## 2014-10-20 ENCOUNTER — Encounter: Payer: Self-pay | Admitting: Podiatry

## 2014-10-20 DIAGNOSIS — M79676 Pain in unspecified toe(s): Secondary | ICD-10-CM | POA: Diagnosis not present

## 2014-10-20 DIAGNOSIS — B351 Tinea unguium: Secondary | ICD-10-CM | POA: Diagnosis not present

## 2014-10-20 NOTE — Progress Notes (Signed)
Patient ID: Paula [name], female   DOB: 07/06/1940, 74 y.o.   MRN: [id number] HPI  Complaint:  Visit Type: Patient returns to my office for continued preventative foot care services. Complaint: Patient states" my nails have grown long and thick and become painful to walk and wear shoes" Patient has been diagnosed with DM with neuropathy  and circulatory problems..She presents for preventative foot care services. No changes to ROS  Podiatric Exam: Vascular: dorsalis pedis and posterior tibial pulses are negative. Capillary return is diminished. Temperature gradient is negative. Skin turgor WNL,  Cold feet noted  Sensorium: Diminished semmes weinstein noted.  Nail Exam: Pt has thick disfigured discolored nails with subungual debris noted bilateral entire nail hallux through fifth toenails Ulcer Exam: There is no evidence of ulcer or pre-ulcerative changes or infection. Orthopedic Exam: Muscle tone and strength are WNL. No limitations in general ROM. No crepitus or effusions noted. Foot type and digits show no abnormalities. Bony prominences are unremarkable. Skin: No Porokeratosis. No infection or ulcers  Diagnosis:  Tinea unguium, Pain in right toe, pain in left toes  Treatment & Plan Procedures and Treatment: Consent by patient was obtained for treatment procedures. The patient understood the discussion of treatment and procedures well. All questions were answered thoroughly reviewed. Debridement of mycotic and hypertrophic toenails, 1 through 5 bilateral and clearing of subungual debris. No ulceration, no infection noted.  Return Visit-Office Procedure: Patient instructed to return to the office for a follow up visit 3 months for continued evaluation and treatment.

## 2014-10-21 ENCOUNTER — Telehealth: Payer: Self-pay | Admitting: Obstetrics and Gynecology

## 2014-10-21 NOTE — Telephone Encounter (Signed)
Pt requesting refill on her Vesicare 5 mg.

## 2014-11-07 ENCOUNTER — Other Ambulatory Visit: Payer: Self-pay | Admitting: Obstetrics and Gynecology

## 2014-11-07 MED ORDER — SOLIFENACIN SUCCINATE 5 MG PO TABS: ORAL_TABLET | ORAL | Status: DC

## 2014-11-07 NOTE — Telephone Encounter (Signed)
vesicare ordered thru her mail in pharmacy

## 2014-11-09 ENCOUNTER — Ambulatory Visit (INDEPENDENT_AMBULATORY_CARE_PROVIDER_SITE_OTHER): Payer: Medicare Other | Admitting: Obstetrics and Gynecology

## 2014-11-09 ENCOUNTER — Encounter: Payer: Self-pay | Admitting: Obstetrics and Gynecology

## 2014-11-09 VITALS — BP 170/74 | Ht 67.0 in | Wt 171.0 lb

## 2014-11-09 DIAGNOSIS — N904 Leukoplakia of vulva: Secondary | ICD-10-CM

## 2014-11-09 DIAGNOSIS — Z01419 Encounter for gynecological examination (general) (routine) without abnormal findings: Secondary | ICD-10-CM

## 2014-11-09 MED ORDER — SOLIFENACIN SUCCINATE 10 MG PO TABS: ORAL_TABLET | ORAL | Status: AC

## 2014-11-09 MED ORDER — CLOBETASOL PROP EMOLLIENT BASE 0.05 % EX CREA: 1.0000 "application " | TOPICAL_CREAM | Freq: Two times a day (BID) | CUTANEOUS | Status: AC

## 2014-11-09 NOTE — Progress Notes (Signed)
Patient ID: ASHONTI LEANDRO, female   DOB: 06-11-1940, 74 y.o.   MRN: [id number]  Assessment:  Annual Gyn Exam  Atrophic Vulvar Dystrophy  Overactive bladder Chronic discomfort from loose Bm's tx with cornstarch to continue Plan:  1. pap smear no longer indicated 2. return annually or prn for exam and tx of conditions, monitor skin changes 3    Annual mammogram advised 4.  Rx vesicare 10 mg qd Subjective:  Delorus B Daughenbaugh is a 74 y.o. female w/ a history of abdominal hysterectomy. who presents for annual exam. And review of conditions, Vulvar dystrophy,chronic,  and incontinence, urge type using 5-10 depends /day  No LMP recorded. Patient has had a hysterectomy.  Patient complains of intermittent chronic loose stools, which occur either every week or every month. She has to clean her anogenital region, frequently, which irritates the skin. She has tried Imodium with some relief.  The patient complains that she gained weight and tires easily when exercising because her legs feel weak.  Pt states that she is completely out of her vesicare and needs it refilled ASAP. She denies any intolerable side effects.  Patient has on her a postmenopausal hormone patch. She sees Dr. Felecia Shelling, who hasn't yet had any opposition to her using it. Patient uses Seasorb which alleviates her blisters.  The following portions of the patient's history were reviewed and updated as appropriate: allergies, current medications, past family history, past medical history, past social history, past surgical history and problem list. Past Medical History  Diagnosis Date  . Hypertension   . Diabetes mellitus without complication   . GERD (gastroesophageal reflux disease)   . Neuropathy   . Vertigo   . Irritable bowel syndrome (IBS)     Past Surgical History  Procedure Laterality Date  . Yag laser application Right 11/10/2012    Procedure: YAG LASER APPLICATION;  Surgeon: Loraine Leriche T. Nile Riggs, MD;  Location: [facility];  Service:  Ophthalmology;  Laterality: Right;  . Abdominal hysterectomy    . Back surgery    . Yag laser application Left 12/08/2012    Procedure: YAG LASER APPLICATION;  Surgeon: Loraine Leriche T. Nile Riggs, MD;  Location: [facility];  Service: Ophthalmology;  Laterality: Left;  . Esophagogastroduodenoscopy  01/24/2005    Dr. Karilyn Cota :  Nonerosive antral gastritis, otherwise normal esophagogastroduodenoscopy. No evidence of Barrett's esophagus  . Colonoscopy  01/24/2005    Dr. Rehman:Few small diverticula at sigmoid colon. No evidence of colitis or stricture or mass. Random biopsies were taken from the sigmoid colon      looking for microscopic/collagenous colitis.  . Colonoscopy  2009    Dr. Jena Gauss: ischemic colitis, sigmoid diverticula     Current outpatient prescriptions:  .  aspirin 81 MG tablet, Take 81 mg by mouth daily., Disp: , Rfl:  .  benazepril (LOTENSIN) 10 MG tablet, Take 10 mg by mouth daily., Disp: , Rfl:  .  desipramine (NORPRAMIN) 50 MG tablet, Take 1 tablet (50 mg total) by mouth 3 (three) times daily., Disp: 270 tablet, Rfl: 1 .  diphenoxylate-atropine (LOMOTIL) 2.5-0.025 MG per tablet, Take 1 tablet by mouth as needed for diarrhea or loose stools., Disp: , Rfl:  .  esomeprazole (NEXIUM) 40 MG capsule, Take 40 mg by mouth 2 (two) times daily. , Disp: , Rfl:  .  estradiol (VIVELLE-DOT) 0.05 MG/24HR patch, Place 1 patch (0.05 mg total) onto the skin once a week., Disp: 8 patch, Rfl: 12 .  furosemide (LASIX) 40 MG tablet, Take 40 mg  by mouth daily., Disp: , Rfl:  .  gabapentin (NEURONTIN) 800 MG tablet, Take 1 tablet (800 mg total) by mouth 3 (three) times daily., Disp: 270 tablet, Rfl: 1 .  meclizine (ANTIVERT) 50 MG tablet, Take 50 mg by mouth 3 (three) times daily as needed., Disp: , Rfl:  .  metFORMIN (GLUCOPHAGE) 500 MG tablet, Take 500 mg by mouth 2 (two) times daily with a meal., Disp: , Rfl:  .  Multiple Vitamin (MULTIVITAMIN) tablet, Take 1 tablet by mouth daily., Disp: , Rfl:  .  oxcarbazepine  (TRILEPTAL) 600 MG tablet, 1/2 tablet in the morning, 1/2 tab at noon and one tab each night (Patient taking differently: 1/2 tablet in the morning, 1/2 tab at dinner,  and 1 tab each night), Disp: 180 tablet, Rfl: 1 .  Simvastatin (ZOCOR PO), Take 10 mg by mouth daily. , Disp: , Rfl:  .  solifenacin (VESICARE) 5 MG tablet, TAKE ONE TABLET BY MOUTH DAILY., Disp: 90 tablet, Rfl: 3  Review of Systems Constitutional: negative Gastrointestinal: negative Genitourinary: negative Musculoskeletal: leg weakness associated with aging Skin: scaly skin on back A complete review of systems was obtained and all systems are negative except as noted in the HPI and PMH.    Objective:  BP 170/74 mmHg  Ht 5\' 7"  (1.702 m)  Wt 171 lb (77.565 kg)  BMI 26.78 kg/m2   BMI: Body mass index is 26.78 kg/(m^2).  General Appearance: Alert, appropriate appearance for age. No acute distress HEENT: Grossly normal Neck / Thyroid:  Cardiovascular: RRR; normal S1, S2, no murmur Lungs: CTA bilaterally Back: No CVAT Breast Exam: No dimpling, nipple retraction or discharge. No masses or nodes., Normal to inspection, Normal breast tissue bilaterally and No masses or nodes.No dimpling, nipple retraction or discharge. Gastrointestinal: Soft, non-tender, no masses or organomegaly Pelvic Exam: Vulva and vagina appear chronically thinned out on inner labia majora. Loss of L minora completely  External genitalia: Chronic moisture changes around the anus and introitus Urinary system: urethral meatus normal Vaginal: healthy appearing vaginal tissues, adequate support of vaginal cuff  Rectal: good sphincter tone and guaiac negative Rectovaginal: not indicated Lymphatic Exam: Non-palpable nodes in neck, clavicular, axillary, or inguinal regions  Skin: no rash or abnormalities Neurologic: Normal gait and speech, no tremor  Psychiatric: Alert and oriented, appropriate affect.  Urinalysis:Not done  Christin Bach. MD Pgr  [phone] 11:24 AM   This chart was SCRIBED for Christin Bach, MD by Ronney Lion, ED Scribe. This patient was seen in room 1 and the patient's care was started at 11:24 AM.  I personally performed the services described in this documentation, which was SCRIBED in my presence. The recorded information has been reviewed and considered accurate. It has been edited as necessary during review. Tilda Burrow, MD

## 2014-11-09 NOTE — Progress Notes (Signed)
Patient ID: Paula [name], female   DOB: 01/12/1941, 74 y.o.   MRN: [id number] Pt here today for her annual exam. Pt states that she is completely out of her vesicare and needs it refilled ASAP!! Pt denies any problems or concerns at this time.

## 2014-11-27 ENCOUNTER — Emergency Department (HOSPITAL_COMMUNITY): Payer: Medicare Other

## 2014-11-27 ENCOUNTER — Emergency Department (HOSPITAL_COMMUNITY)
Admission: EM | Admit: 2014-11-27 | Discharge: 2014-12-19 | Disposition: E | Payer: Medicare Other | Attending: Emergency Medicine | Admitting: Emergency Medicine

## 2014-11-27 ENCOUNTER — Encounter (HOSPITAL_COMMUNITY): Payer: Self-pay | Admitting: Emergency Medicine

## 2014-11-27 DIAGNOSIS — Z79899 Other long term (current) drug therapy: Secondary | ICD-10-CM | POA: Insufficient documentation

## 2014-11-27 DIAGNOSIS — Z7952 Long term (current) use of systemic steroids: Secondary | ICD-10-CM | POA: Diagnosis not present

## 2014-11-27 DIAGNOSIS — G629 Polyneuropathy, unspecified: Secondary | ICD-10-CM | POA: Insufficient documentation

## 2014-11-27 DIAGNOSIS — E119 Type 2 diabetes mellitus without complications: Secondary | ICD-10-CM | POA: Diagnosis not present

## 2014-11-27 DIAGNOSIS — I10 Essential (primary) hypertension: Secondary | ICD-10-CM | POA: Diagnosis not present

## 2014-11-27 DIAGNOSIS — Z7982 Long term (current) use of aspirin: Secondary | ICD-10-CM | POA: Insufficient documentation

## 2014-11-27 DIAGNOSIS — K219 Gastro-esophageal reflux disease without esophagitis: Secondary | ICD-10-CM | POA: Diagnosis not present

## 2014-11-27 DIAGNOSIS — Z043 Encounter for examination and observation following other accident: Secondary | ICD-10-CM | POA: Diagnosis present

## 2014-11-27 DIAGNOSIS — I639 Cerebral infarction, unspecified: Secondary | ICD-10-CM

## 2014-11-27 DIAGNOSIS — R0602 Shortness of breath: Secondary | ICD-10-CM

## 2014-11-27 LAB — COMPREHENSIVE METABOLIC PANEL
ALT: 41 U/L (ref 14–54)
ANION GAP: 17 — AB (ref 5–15)
AST: 51 U/L — ABNORMAL HIGH (ref 15–41)
Albumin: 3.9 g/dL (ref 3.5–5.0)
Alkaline Phosphatase: 116 U/L (ref 38–126)
BUN: 27 mg/dL — AB (ref 6–20)
CALCIUM: 8.6 mg/dL — AB (ref 8.9–10.3)
CO2: 24 mmol/L (ref 22–32)
CREATININE: 1.64 mg/dL — AB (ref 0.44–1.00)
Chloride: 96 mmol/L — ABNORMAL LOW (ref 101–111)
GFR calc non Af Amer: 30 mL/min — ABNORMAL LOW (ref >60)
GFR, EST AFRICAN AMERICAN: 34 mL/min — AB (ref >60)
GLUCOSE: 159 mg/dL — AB (ref 65–99)
Potassium: 4.7 mmol/L (ref 3.5–5.1)
SODIUM: 137 mmol/L (ref 135–145)
TOTAL PROTEIN: 7.4 g/dL (ref 6.5–8.1)
Total Bilirubin: 0.7 mg/dL (ref 0.3–1.2)

## 2014-11-27 LAB — CBC WITH DIFFERENTIAL/PLATELET
BASOS PCT: 0 % (ref 0–1)
Basophils Absolute: 0 10*3/uL (ref 0.0–0.1)
EOS ABS: 0 10*3/uL (ref 0.0–0.7)
EOS PCT: 0 % (ref 0–5)
HEMATOCRIT: 39.5 % (ref 36.0–46.0)
HEMOGLOBIN: 12.8 g/dL (ref 12.0–15.0)
Lymphocytes Relative: 4 % — ABNORMAL LOW (ref 12–46)
Lymphs Abs: 0.7 10*3/uL (ref 0.7–4.0)
MCH: 29.4 pg (ref 26.0–34.0)
MCHC: 32.4 g/dL (ref 30.0–36.0)
MCV: 90.8 fL (ref 78.0–100.0)
MONO ABS: 0.2 10*3/uL (ref 0.1–1.0)
MONOS PCT: 1 % — AB (ref 3–12)
NEUTROS PCT: 95 % — AB (ref 43–77)
Neutro Abs: 19.3 10*3/uL — ABNORMAL HIGH (ref 1.7–7.7)
Platelets: 218 10*3/uL (ref 150–400)
RBC: 4.35 MIL/uL (ref 3.87–5.11)
RDW: 13.8 % (ref 11.5–15.5)
WBC: 20.2 10*3/uL — ABNORMAL HIGH (ref 4.0–10.5)

## 2014-11-27 LAB — I-STAT CG4 LACTIC ACID, ED: Lactic Acid, Venous: 7.37 mmol/L (ref 0.5–2.0)

## 2014-11-27 LAB — CK: CK TOTAL: 307 U/L — AB (ref 38–234)

## 2014-11-27 LAB — CBG MONITORING, ED: GLUCOSE-CAPILLARY: 156 mg/dL — AB (ref 65–99)

## 2014-11-27 LAB — TROPONIN I: TROPONIN I: 0.04 ng/mL — AB (ref <0.031)

## 2014-11-27 MED ORDER — SODIUM CHLORIDE 0.9 % IV BOLUS (SEPSIS)
1000.0000 mL | Freq: Once | INTRAVENOUS | Status: AC
  Administered 2014-11-27: 1000 mL via INTRAVENOUS

## 2014-11-27 MED ORDER — SODIUM CHLORIDE 0.9 % IV BOLUS (SEPSIS): 1000.0000 mL | Freq: Once | INTRAVENOUS | Status: DC

## 2014-11-27 MED ORDER — DEXTROSE 5 % IV SOLN: 1.0000 g | Freq: Once | INTRAVENOUS | Status: DC

## 2014-12-19 DIAGNOSIS — 419620001 Death: Secondary | SNOMED CT | POA: Insufficient documentation

## 2014-12-19 NOTE — ED Notes (Addendum)
Per EMS: Pt found by family today in kitchen floor today.  Pt has been in floor since last night and has not slept.  Pt answering questions appropriately when stimulated. Pt having snoring respirations and difficult to stimulate.  Pt has no pain, no blood thinners.  Pt shivering at this time with flexion inward to her body. cbg 148 on ems truck

## 2014-12-19 NOTE — ED Notes (Signed)
Cook informed of Lactic Acid of 7.37

## 2014-12-19 NOTE — ED Provider Notes (Signed)
CSN: [id number]     Arrival date & time 2014/12/16  1716 History   First MD Initiated Contact with Patient 12-16-14 1720     Chief Complaint  Patient presents with  . Fall     (Consider location/radiation/quality/duration/timing/severity/associated sxs/prior Treatment) HPI..... Level V caveat for acuity of condition. Patient was found by her family slumped on the floor. She had been down for approximately 20 hours. No previous history of stroke or myocardial infarction. She has not been ill lately. She has long-standing hypertension, diabetes, peripheral neuropathy. Arrival to the emergency department, patient was difficult to arouse.  Past Medical History  Diagnosis Date  . Hypertension   . Diabetes mellitus without complication   . GERD (gastroesophageal reflux disease)   . Neuropathy   . Vertigo   . Irritable bowel syndrome (IBS)    Past Surgical History  Procedure Laterality Date  . Yag laser application Right 11/10/2012    Procedure: YAG LASER APPLICATION;  Surgeon: Loraine Leriche T. Nile Riggs, MD;  Location: [facility];  Service: Ophthalmology;  Laterality: Right;  . Abdominal hysterectomy    . Back surgery    . Yag laser application Left 12/08/2012    Procedure: YAG LASER APPLICATION;  Surgeon: Loraine Leriche T. Nile Riggs, MD;  Location: [facility];  Service: Ophthalmology;  Laterality: Left;  . Esophagogastroduodenoscopy  01/24/2005    Dr. Karilyn Cota :  Nonerosive antral gastritis, otherwise normal esophagogastroduodenoscopy. No evidence of Barrett's esophagus  . Colonoscopy  01/24/2005    Dr. Rehman:Few small diverticula at sigmoid colon. No evidence of colitis or stricture or mass. Random biopsies were taken from the sigmoid colon      looking for microscopic/collagenous colitis.  . Colonoscopy  2009    Dr. Jena Gauss: ischemic colitis, sigmoid diverticula   Family History  Problem Relation Age of Onset  . Diabetes Mother   . Stroke Mother   . Diabetes Father   . Heart attack Father   . Diabetes Brother   .  Hypertension Brother   . Hypertension Brother   . Colon cancer Neg Hx    History  Substance Use Topics  . Smoking status: Never Smoker   . Smokeless tobacco: Never Used  . Alcohol Use: No   OB History    No data available     Review of Systems  Unable to perform ROS: Acuity of condition      Allergies  Ivp dye  Home Medications   Prior to Admission medications   Medication Sig Start Date End Date Taking? Authorizing Provider  aspirin 81 MG tablet Take 81 mg by mouth daily.    Historical Provider, MD  benazepril (LOTENSIN) 10 MG tablet Take 10 mg by mouth daily.    Historical Provider, MD  Clobetasol Prop Emollient Base (CLOBETASOL PROPIONATE E) 0.05 % emollient cream Apply 1 application topically 2 (two) times daily. For up to a week at at time for worsening vulvar itching and discomfort 11/09/14   Tilda Burrow, MD  desipramine (NORPRAMIN) 50 MG tablet Take 1 tablet (50 mg total) by mouth 3 (three) times daily. 08/03/14   Nilda Riggs, NP  diphenoxylate-atropine (LOMOTIL) 2.5-0.025 MG per tablet Take 1 tablet by mouth as needed for diarrhea or loose stools.    Historical Provider, MD  esomeprazole (NEXIUM) 40 MG capsule Take 40 mg by mouth 2 (two) times daily.     Historical Provider, MD  estradiol (VIVELLE-DOT) 0.05 MG/24HR patch Place 1 patch (0.05 mg total) onto the skin once a week. 05/11/14  Tilda Burrow, MD  furosemide (LASIX) 40 MG tablet Take 40 mg by mouth daily.    Historical Provider, MD  gabapentin (NEURONTIN) 800 MG tablet Take 1 tablet (800 mg total) by mouth 3 (three) times daily. 06/20/14   Levert Feinstein, MD  meclizine (ANTIVERT) 50 MG tablet Take 50 mg by mouth 3 (three) times daily as needed.    Historical Provider, MD  metFORMIN (GLUCOPHAGE) 500 MG tablet Take 500 mg by mouth 2 (two) times daily with a meal.    Historical Provider, MD  Multiple Vitamin (MULTIVITAMIN) tablet Take 1 tablet by mouth daily.    Historical Provider, MD  oxcarbazepine  (TRILEPTAL) 600 MG tablet 1/2 tablet in the morning, 1/2 tab at noon and one tab each night Patient taking differently: 1/2 tablet in the morning, 1/2 tab at dinner,  and 1 tab each night 06/20/14   Levert Feinstein, MD  Simvastatin (ZOCOR PO) Take 10 mg by mouth daily.     Historical Provider, MD  solifenacin (VESICARE) 10 MG tablet TAKE ONE TABLET BY MOUTH DAILY. 11/09/14   Tilda Burrow, MD   BP 179/134 mmHg  Pulse 89  Temp(Src) 97.7 F (36.5 C) (Oral)  Resp 26  Ht 5\' 7"  (1.702 m)  Wt 167 lb (75.751 kg)  BMI 26.15 kg/m2  SpO2 94% Physical Exam  Constitutional:  Patient appears to be shivering; she will respond by opening her eyes to direct questions  HENT:  Head: Normocephalic and atraumatic.  Eyes: Conjunctivae are normal. Pupils are equal, round, and reactive to light.  Neck: Normal range of motion. Neck supple.  Cardiovascular: Normal rate and regular rhythm.   Pulmonary/Chest: Effort normal and breath sounds normal.  Abdominal: Soft. Bowel sounds are normal.  Musculoskeletal:  Unable  Neurological:  Flexion of elbow and wrists noted on right.  Patient is very intended  Skin: Skin is warm and dry.  Psychiatric:  Unable  Nursing note and vitals reviewed.   ED Course  Procedures (including critical care time) Labs Review Labs Reviewed  CBC WITH DIFFERENTIAL/PLATELET - Abnormal; Notable for the following:    WBC 20.2 (*)    Neutrophils Relative % 95 (*)    Neutro Abs 19.3 (*)    Lymphocytes Relative 4 (*)    Monocytes Relative 1 (*)    All other components within normal limits  COMPREHENSIVE METABOLIC PANEL - Abnormal; Notable for the following:    Chloride 96 (*)    Glucose, Bld 159 (*)    BUN 27 (*)    Creatinine, Ser 1.64 (*)    Calcium 8.6 (*)    AST 51 (*)    GFR calc non Af Amer 30 (*)    GFR calc Af Amer 34 (*)    Anion gap 17 (*)    All other components within normal limits  TROPONIN I - Abnormal; Notable for the following:    Troponin I 0.04 (*)    All  other components within normal limits  CK - Abnormal; Notable for the following:    Total CK 307 (*)    All other components within normal limits  CBG MONITORING, ED - Abnormal; Notable for the following:    Glucose-Capillary 156 (*)    All other components within normal limits  I-STAT CG4 LACTIC ACID, ED - Abnormal; Notable for the following:    Lactic Acid, Venous 7.37 (*)    All other components within normal limits  CULTURE, BLOOD (ROUTINE X 2)  CULTURE, BLOOD (  ROUTINE X 2)  URINALYSIS, ROUTINE W REFLEX MICROSCOPIC (NOT AT [facility])  LACTIC ACID, PLASMA    Imaging Review Dg Chest 1 View  December 08, 2014   CLINICAL DATA:  Found down, shortness of breath  EXAM: CHEST  1 VIEW  COMPARISON:  02/07/2014  FINDINGS: Lungs are essentially clear. No focal consolidation. No pleural effusion or pneumothorax.  Mild cardiomegaly.  IMPRESSION: No evidence of acute cardiopulmonary disease.   Electronically Signed   By: Charline Bills M.D.   On: 12/08/14 18:22   Ct Head Wo Contrast  12/08/2014   CLINICAL DATA:  Found down today on the kitchen floor at home. Altered mental status.  EXAM: CT HEAD WITHOUT CONTRAST  TECHNIQUE: Contiguous axial images were obtained from the base of the skull through the vertex without intravenous contrast.  COMPARISON:  10/08/2013  FINDINGS: Stable age related cerebral atrophy, ventriculomegaly and periventricular white matter disease. No extra-axial fluid collections are identified. There is a remote appearing lacunar type infarct in the right caudate area of low was not present on the prior CT from 2015. Suspect new left-sided caudate area and far with ill-defined low-attenuation and mild mass effect on the frontal horn of the left lateral ventricle. No findings for hemispheric infarction or intracranial hemorrhage. The brainstem and cerebellum are grossly normal.  The bony structures are unremarkable. No acute skull fracture. Fairly extensive left mastoid effusion is noted. The  globes are intact.  IMPRESSION: 1. CT findings suspicious for a left caudate and lenticular nuclei infarct with mild mass effect on the frontal horn of the left lateral ventricle. No hemorrhage. MRI with diffusion weighted imaging may be helpful for further evaluation. 2. Remote appearing right caudate lacunar infarct. 3. No findings for hemispheric infarction or intracranial hemorrhage.   Electronically Signed   By: Rudie Meyer M.D.   On: 12-08-2014 18:23     EKG Interpretation   Date/Time:  Sunday 2014-12-08 17:26:25 EDT Ventricular Rate:  91 PR Interval:    QRS Duration: 165 QT Interval:  401 QTC Calculation: 493 R Axis:   -58 Text Interpretation:  Atrial fibrillation RBBB and LAFB Left ventricular  hypertrophy Confirmed by Julieta Rogalski  MD, Carletta Feasel ([id number]) on December 08, 2014 5:58:21 PM     CRITICAL CARE Performed by: Donnetta Hutching Total critical care time: 70 Critical care time was exclusive of separately billable procedures and treating other patients. Critical care was necessary to treat or prevent imminent or life-threatening deterioration. Critical care was time spent personally by me on the following activities: development of treatment plan with patient and/or surrogate as well as nursing, discussions with consultants, evaluation of patient's response to treatment, examination of patient, obtaining history from patient or surrogate, ordering and performing treatments and interventions, ordering and review of laboratory studies, ordering and review of radiographic studies, pulse oximetry and re-evaluation of patient's condition. MDM   Final diagnoses:  Cerebral infarction due to unspecified mechanism  Death    CT head reveals a suspicious area in the left caudate and lenticular nuclei with mild mass effect on the frontal horn of the left lateral ventricle. No hemorrhage. CODE STATUS was discussed with the son and daughter. Patient had a desire for no heroic measures to save her life. Patient  never recovered neurological status. Her rhythm varied from normal sinus to ventricular tachycardia to bradycardia to asystole. Death was declared at 68. Discussed with Dr. Karilyn Cota who was on-call for Dr. Tera Partridge, MD 2014-12-08 [phone]

## 2014-12-19 NOTE — ED Notes (Signed)
Asystole on monitor, no pulse, no respirations. Dr. Adriana Simasook called time of death at

## 2014-12-19 NOTE — ED Notes (Signed)
[name], patient rigid, right foot flexion. Upon moving patient patients HR decreased to 60's from baseline 100's and flipped to an Afib. Dr. Adriana Simasook informed in to assess patient. Patient are progressively increasing runs of vtach and vfib. Adriana SimasCook spoke with family who decided not to ensue heroic measures. Alls of patients family at bedside, at this time.

## 2014-12-19 NOTE — ED Notes (Signed)
All patient belongings including nightgown, underwear, bra and watch given to family.

## 2014-12-19 DEATH — deceased

## 2015-02-02 ENCOUNTER — Ambulatory Visit: Payer: Medicare Other | Admitting: Podiatry

## 2015-02-07 ENCOUNTER — Ambulatory Visit: Payer: Medicare Other | Admitting: Nurse Practitioner

## 2023-06-24 NOTE — Progress Notes (Signed)
[facility]
